# Patient Record
Sex: Female | Born: 2000
Health system: Southern US, Community
[De-identification: ages and names within clinical notes are randomized; demographics above are authoritative.]

## PROBLEM LIST (undated history)

## (undated) DIAGNOSIS — L709 Acne, unspecified: Secondary | ICD-10-CM

## (undated) DIAGNOSIS — F909 Attention-deficit hyperactivity disorder, unspecified type: Secondary | ICD-10-CM

## (undated) DIAGNOSIS — N920 Excessive and frequent menstruation with regular cycle: Secondary | ICD-10-CM

## (undated) DIAGNOSIS — Z8742 Personal history of other diseases of the female genital tract: Secondary | ICD-10-CM

## (undated) HISTORY — DX: Personal history of other diseases of the female genital tract: Z87.42

## (undated) HISTORY — DX: Attention-deficit hyperactivity disorder, unspecified type: F90.9

## (undated) HISTORY — PX: WISDOM TOOTH EXTRACTION: SHX21

## (undated) HISTORY — DX: Acne, unspecified: L70.9

## (undated) HISTORY — DX: Excessive and frequent menstruation with regular cycle: N92.0

---
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
## 2018-01-27 DIAGNOSIS — F902 Attention-deficit hyperactivity disorder, combined type: Secondary | ICD-10-CM | POA: Diagnosis not present

## 2018-01-27 DIAGNOSIS — Z79899 Other long term (current) drug therapy: Secondary | ICD-10-CM | POA: Diagnosis not present

## 2018-03-09 MED FILL — ADDERALL XR 10 MG CAP SA: 10 | 30 days supply | Qty: 30 | Fill #0

## 2018-03-09 MED FILL — ADDERALL XR 20 MG CAP SA: 20 | 30 days supply | Qty: 30 | Fill #0

## 2018-04-12 DIAGNOSIS — Z79899 Other long term (current) drug therapy: Secondary | ICD-10-CM | POA: Diagnosis not present

## 2018-04-12 DIAGNOSIS — F902 Attention-deficit hyperactivity disorder, combined type: Secondary | ICD-10-CM | POA: Diagnosis not present

## 2018-04-12 MED FILL — ADDERALL XR 20 MG CAP SA: 20 | 30 days supply | Qty: 30 | Fill #0

## 2018-04-12 MED FILL — ADDERALL XR 10 MG CAP SA: 10 | 30 days supply | Qty: 30 | Fill #0

## 2018-05-08 MED FILL — ADDERALL XR 20 MG CAP SA: 20 | 30 days supply | Qty: 30 | Fill #0

## 2018-06-15 MED FILL — ADDERALL XR 20 MG CAP SA: 20 | 30 days supply | Qty: 30 | Fill #0

## 2018-06-23 ENCOUNTER — Encounter: Payer: Self-pay | Admitting: Family Medicine

## 2018-06-23 ENCOUNTER — Other Ambulatory Visit: Payer: Self-pay

## 2018-06-23 ENCOUNTER — Ambulatory Visit: Payer: 59 | Admitting: Family Medicine

## 2018-06-23 VITALS — BP 122/70 | HR 78 | Temp 98.6°F | Ht 66.0 in | Wt 140.2 lb

## 2018-06-23 DIAGNOSIS — Z30011 Encounter for initial prescription of contraceptive pills: Secondary | ICD-10-CM | POA: Diagnosis not present

## 2018-06-23 DIAGNOSIS — N920 Excessive and frequent menstruation with regular cycle: Secondary | ICD-10-CM | POA: Diagnosis not present

## 2018-06-23 DIAGNOSIS — J302 Other seasonal allergic rhinitis: Secondary | ICD-10-CM | POA: Diagnosis not present

## 2018-06-23 DIAGNOSIS — F909 Attention-deficit hyperactivity disorder, unspecified type: Secondary | ICD-10-CM | POA: Diagnosis not present

## 2018-06-23 DIAGNOSIS — Z87898 Personal history of other specified conditions: Secondary | ICD-10-CM | POA: Diagnosis not present

## 2018-06-23 LAB — POCT URINE PREGNANCY: Preg Test, Ur: NEGATIVE

## 2018-06-23 MED ORDER — NORETHIN ACE-ETH ESTRAD-FE 1-20 MG-MCG(24) PO TABS: 1.0000 | ORAL_TABLET | Freq: Every day | ORAL | 11 refills | Status: DC

## 2018-06-23 MED FILL — BLISOVI 24 FE 1-20 MG-MCG(2: 1-20 | 84 days supply | Qty: 84 | Fill #0

## 2018-06-23 NOTE — Progress Notes (Signed)
Subjective:    Patient ID: Mary Steele, female    DOB: 08-Sep-2000, 18 y.o.   MRN: [id number]  HPI Chief Complaint  Patient presents with  . new pt    new pt, get estbalished.. would like adhd- sees [place] [facility]- but very pricey, would like to start on birth control for cramps.    She is new to the practice and is here to establish care.  She would like to discuss birth control with heavy painful periods.  Previous medical care: [facility]  Other providers: [facility]    Complains of severe cramping and heavy bleeding for the first few days of her cycle each month.  She also reports having nausea and vomiting at times as well as diarrhea.  Her periods last for 5 days.  They are regular.  She denies having any PMS symptoms.  States she has tried multiple over-the-counter medication such as Midol, Motrin, and others.  States she has tried taking them for 1 to 2 days before her period begins and it still does not help.  She would like to see if hormones help improve her symptoms. Denies personal or family history of blood clotting disorder. No migraine headache history.   Denies being sexually active for at least the past 6 months but states she has been sexually active prior to that.  States she does not plan on becoming sexually active again anytime soon.  She is a Medical laboratory scientific officer at [employer].   States she was diagnosed with ADHD in July 2019 at [facility].  States they have been adjusting her medication dose and she is happy with her current regimen. States she is not happy with how often she is having to see them and the testing she is having to do at each visit. She also states the cost to go there is an issue.  She is requesting that I take over prescribing her ADHD medication.  States she is currently taking Adderall XR 20 mg in the morning. It lasts until 3-4 pm. She then takes 10 mg around 4 PM if needed.   States at  one point the dose they had her on was too high.  States she had palpitations and lost weight so her dose had to be readjusted last November.  States she had to go to an urgent care in [place] to be evaluated for palpitations.  States her EKG was normal and they determined that the high-dose Concerta as she was taking at the time was causing her symptoms.  States she has not had palpitations since her medication was readjusted in November. She cannot recall the last time she had blood work done.  Seasonal allergies- Zyrtec daily   Denies smoking, drinking alcohol, drug use  Diet: Fairly healthy She does exercise  Reviewed allergies, medications, past medical, surgical, family, and social history.   Review of Systems Pertinent positives and negatives in the history of present illness.     Objective:   Physical Exam BP 122/70   Pulse 78   Temp 98.6 F (37 C) (Oral)   Ht 5\' 6"  (1.676 m)   Wt 140 lb 3.2 oz (63.6 kg)   LMP 06/02/2018   SpO2 98%   BMI 22.63 kg/m   Alert and in no distress.  Neck is supple without adenopathy or thyromegaly. Cardiac exam shows a regular sinus rhythm without murmurs or gallops. Lungs are clear to auscultation. Extremities without edema. Skin is warm and dry, no  pallor or rash.    UPT is negative     Assessment & Plan:  Menorrhagia with regular cycle - Plan: Comprehensive metabolic panel, TSH, T4, free, POCT urine pregnancy, CBC with Differential/Platelet, Norethindrone Acetate-Ethinyl Estrad-FE (BLISOVI 24 FE) 1-20 MG-MCG(24) tablet  Attention deficit hyperactivity disorder (ADHD), unspecified ADHD type  History of palpitations - Plan: TSH, T4, free  Encounter for BCP (birth control pills) initial prescription - Plan: POCT urine pregnancy, Norethindrone Acetate-Ethinyl Estrad-FE (BLISOVI 24 FE) 1-20 MG-MCG(24) tablet  Seasonal allergies  She is a pleasant 18 year old female who is new to me and here to establish care.  We will start her on  birth control pills to see if we can help with menorrhagia.  Her cycles are regular. States she will not have an issue taking the pill at the same time every day.  We discussed potential side effects and that the effectiveness of the pill decreases if not taken appropriately or if she has to take antibiotics.  Advised her to use a second level of protection if she does have sex in the next week.  We also discussed that birth control pills are not protection against STDs. [place] being [place] [facility] for [facility].  I will request records from there since she wants me to take over prescribing her Adderall.  She is aware that I will need to look over the notes and recommendations from them before I make the decision on whether I am comfortable prescribing this for her. Seasonal allergies are well controlled. Follow-up pending labs or if birth control is not helping with her painful periods.

## 2018-06-23 NOTE — Patient Instructions (Signed)

## 2018-06-24 LAB — COMPREHENSIVE METABOLIC PANEL
ALT: 6 IU/L (ref 0–32)
AST: 14 IU/L (ref 0–40)
Albumin/Globulin Ratio: 2 (ref 1.2–2.2)
Albumin: 4.8 g/dL (ref 3.9–5.0)
Alkaline Phosphatase: 71 IU/L (ref 39–117)
BUN/Creatinine Ratio: 10 (ref 9–23)
BUN: 8 mg/dL (ref 6–20)
Bilirubin Total: 0.7 mg/dL (ref 0.0–1.2)
CO2: 21 mmol/L (ref 20–29)
Calcium: 9.5 mg/dL (ref 8.7–10.2)
Chloride: 101 mmol/L (ref 96–106)
Creatinine, Ser: 0.8 mg/dL (ref 0.57–1.00)
GFR calc Af Amer: 124 mL/min/{1.73_m2} (ref >59)
GFR calc non Af Amer: 107 mL/min/{1.73_m2} (ref >59)
Globulin, Total: 2.4 g/dL (ref 1.5–4.5)
Glucose: 76 mg/dL (ref 65–99)
Potassium: 4.5 mmol/L (ref 3.5–5.2)
Sodium: 139 mmol/L (ref 134–144)
Total Protein: 7.2 g/dL (ref 6.0–8.5)

## 2018-06-24 LAB — CBC WITH DIFFERENTIAL/PLATELET
Basophils Absolute: 0.1 10*3/uL (ref 0.0–0.2)
Basos: 1 %
EOS (ABSOLUTE): 0.1 10*3/uL (ref 0.0–0.4)
Eos: 1 %
Hematocrit: 41.1 % (ref 34.0–46.6)
Hemoglobin: 14.2 g/dL (ref 11.1–15.9)
Immature Grans (Abs): 0 10*3/uL (ref 0.0–0.1)
Immature Granulocytes: 0 %
Lymphocytes Absolute: 2.7 10*3/uL (ref 0.7–3.1)
Lymphs: 33 %
MCH: 31.5 pg (ref 26.6–33.0)
MCHC: 34.5 g/dL (ref 31.5–35.7)
MCV: 91 fL (ref 79–97)
Monocytes Absolute: 0.4 10*3/uL (ref 0.1–0.9)
Monocytes: 5 %
Neutrophils Absolute: 4.9 10*3/uL (ref 1.4–7.0)
Neutrophils: 60 %
Platelets: 316 10*3/uL (ref 150–450)
RBC: 4.51 x10E6/uL (ref 3.77–5.28)
RDW: 12 % (ref 11.7–15.4)
WBC: 8.1 10*3/uL (ref 3.4–10.8)

## 2018-06-24 LAB — T4, FREE: Free T4: 1.51 ng/dL (ref 0.93–1.60)

## 2018-06-24 LAB — TSH: TSH: 1.21 u[IU]/mL (ref 0.450–4.500)

## 2018-06-28 DIAGNOSIS — L738 Other specified follicular disorders: Secondary | ICD-10-CM | POA: Diagnosis not present

## 2018-06-28 DIAGNOSIS — L0101 Non-bullous impetigo: Secondary | ICD-10-CM | POA: Diagnosis not present

## 2018-06-28 DIAGNOSIS — B070 Plantar wart: Secondary | ICD-10-CM | POA: Diagnosis not present

## 2018-06-28 MED FILL — DOXYCYCLINE HYCLATE 100 MG: 100 | 14 days supply | Qty: 28 | Fill #0

## 2018-06-28 MED FILL — MUPIROCIN 2% OINTMENT: 2 | 10 days supply | Qty: 22 | Fill #0

## 2018-07-03 ENCOUNTER — Telehealth: Payer: Self-pay | Admitting: Family Medicine

## 2018-07-03 NOTE — Telephone Encounter (Signed)
Pt called and I gave her lab results.

## 2018-07-06 MED FILL — MUPIROCIN 2% OINTMENT: 2 | 10 days supply | Qty: 22 | Fill #0

## 2018-07-13 DIAGNOSIS — Z79899 Other long term (current) drug therapy: Secondary | ICD-10-CM | POA: Diagnosis not present

## 2018-07-13 DIAGNOSIS — F902 Attention-deficit hyperactivity disorder, combined type: Secondary | ICD-10-CM | POA: Diagnosis not present

## 2018-07-13 MED FILL — ADDERALL XR 20 MG CAP SA: 20 | 30 days supply | Qty: 30 | Fill #0

## 2018-07-20 MED FILL — MUPIROCIN 2% OINTMENT: 2 | 10 days supply | Qty: 22 | Fill #1

## 2018-07-20 MED FILL — ADDERALL XR 10 MG CAP SA: 10 | 30 days supply | Qty: 30 | Fill #0

## 2018-07-27 MED FILL — MUPIROCIN 2% OINTMENT: 2 | 10 days supply | Qty: 22 | Fill #2

## 2018-08-02 DIAGNOSIS — B070 Plantar wart: Secondary | ICD-10-CM | POA: Diagnosis not present

## 2018-08-11 MED FILL — ADDERALL XR 20 MG CAP SA: 20 | 30 days supply | Qty: 30 | Fill #0

## 2018-08-11 MED FILL — MUPIROCIN 2% OINTMENT: 2 | 10 days supply | Qty: 22 | Fill #3

## 2018-09-08 ENCOUNTER — Other Ambulatory Visit: Payer: Self-pay | Admitting: *Deleted

## 2018-09-08 DIAGNOSIS — Z20822 Contact with and (suspected) exposure to covid-19: Secondary | ICD-10-CM

## 2018-09-10 LAB — NOVEL CORONAVIRUS, NAA: SARS-CoV-2, NAA: NOT DETECTED

## 2018-09-10 MED FILL — MUPIROCIN 2% OINTMENT: 2 | 10 days supply | Qty: 22 | Fill #4

## 2018-09-11 MED FILL — ADDERALL XR 10 MG CAP SA: 10 | 30 days supply | Qty: 30 | Fill #0

## 2018-09-11 MED FILL — ADDERALL XR 20 MG CAP SA: 20 | 30 days supply | Qty: 30 | Fill #0

## 2018-09-13 ENCOUNTER — Telehealth: Payer: Self-pay | Admitting: Family Medicine

## 2018-09-13 NOTE — Telephone Encounter (Signed)
Requested records received from [place] [facility]

## 2018-09-20 MED FILL — BLISOVI 24 FE 1-20 MG-MCG(2: 1-20 | 84 days supply | Qty: 84 | Fill #1

## 2018-10-13 DIAGNOSIS — Z79899 Other long term (current) drug therapy: Secondary | ICD-10-CM | POA: Diagnosis not present

## 2018-10-13 DIAGNOSIS — F902 Attention-deficit hyperactivity disorder, combined type: Secondary | ICD-10-CM | POA: Diagnosis not present

## 2018-10-13 MED FILL — ADDERALL XR 20 MG CAP SA: 20 | 30 days supply | Qty: 30 | Fill #0

## 2018-10-13 MED FILL — ADDERALL XR 25 MG CAPSULE: 25 | 30 days supply | Qty: 30 | Fill #0

## 2018-10-30 MED FILL — MUPIROCIN 2% OINTMENT: 2 | 10 days supply | Qty: 22 | Fill #5

## 2018-11-13 MED FILL — ADDERALL XR 25 MG CAPSULE: 25 | 30 days supply | Qty: 30 | Fill #0

## 2018-11-23 MED FILL — ADDERALL XR 20 MG CAP SA: 20 | 30 days supply | Qty: 30 | Fill #0

## 2018-12-14 MED FILL — BLISOVI 24 FE 1-20 MG-MCG(2: 1-20 | 84 days supply | Qty: 84 | Fill #2

## 2018-12-26 MED FILL — ADDERALL XR 20 MG CAP SA: 20 | 30 days supply | Qty: 30 | Fill #0

## 2018-12-26 MED FILL — ADDERALL XR 25 MG CAPSULE: 25 | 30 days supply | Qty: 30 | Fill #0

## 2019-01-22 MED FILL — MUPIROCIN 2% OINTMENT: 2 | 10 days supply | Qty: 22 | Fill #0

## 2019-02-07 MED FILL — ADDERALL XR 20 MG CAP SA: 20 | 30 days supply | Qty: 30 | Fill #0

## 2019-02-07 MED FILL — ADDERALL XR 25 MG CAPSULE: 25 | 30 days supply | Qty: 30 | Fill #0

## 2019-02-14 ENCOUNTER — Ambulatory Visit: Payer: 59 | Attending: Internal Medicine

## 2019-03-02 MED FILL — BLISOVI 24 FE 1-20 MG-MCG(2: 1-20 | 84 days supply | Qty: 84 | Fill #3

## 2019-03-12 ENCOUNTER — Ambulatory Visit: Payer: 59 | Attending: Internal Medicine

## 2019-03-12 DIAGNOSIS — Z20822 Contact with and (suspected) exposure to covid-19: Secondary | ICD-10-CM

## 2019-03-13 LAB — NOVEL CORONAVIRUS, NAA: SARS-CoV-2, NAA: NOT DETECTED

## 2019-03-13 LAB — SPECIMEN STATUS REPORT

## 2019-04-06 DIAGNOSIS — Z79899 Other long term (current) drug therapy: Secondary | ICD-10-CM | POA: Diagnosis not present

## 2019-04-06 DIAGNOSIS — F902 Attention-deficit hyperactivity disorder, combined type: Secondary | ICD-10-CM | POA: Diagnosis not present

## 2019-04-06 MED FILL — ADDERALL XR 25 MG CAPSULE: 25 | 30 days supply | Qty: 30 | Fill #0

## 2019-04-06 MED FILL — ADDERALL XR 20 MG CAP SA: 20 | 30 days supply | Qty: 30 | Fill #0

## 2019-05-10 MED FILL — ADDERALL XR 25 MG CAPSULE: 25 | 30 days supply | Qty: 30 | Fill #0

## 2019-05-10 MED FILL — ADDERALL XR 20 MG CAP SA: 20 | 30 days supply | Qty: 30 | Fill #0

## 2019-05-25 ENCOUNTER — Other Ambulatory Visit: Payer: Self-pay | Admitting: Family Medicine

## 2019-05-25 DIAGNOSIS — N920 Excessive and frequent menstruation with regular cycle: Secondary | ICD-10-CM

## 2019-05-25 DIAGNOSIS — Z30011 Encounter for initial prescription of contraceptive pills: Secondary | ICD-10-CM

## 2019-05-25 MED FILL — TARINA 24 FE 1-20 MG-MCG(24: 1-20 | 84 days supply | Qty: 84 | Fill #0

## 2019-05-25 NOTE — Telephone Encounter (Signed)
Left message for pt to call back and schedule an appt. Refill for 30 days

## 2019-06-18 MED FILL — ADDERALL XR 25 MG CAPSULE: 25 | 30 days supply | Qty: 30 | Fill #0

## 2019-06-18 MED FILL — ADDERALL XR 20 MG CAP SA: 20 | 30 days supply | Qty: 30 | Fill #0

## 2019-06-29 ENCOUNTER — Ambulatory Visit (INDEPENDENT_AMBULATORY_CARE_PROVIDER_SITE_OTHER): Payer: 59 | Admitting: Family Medicine

## 2019-06-29 ENCOUNTER — Other Ambulatory Visit: Payer: Self-pay

## 2019-06-29 ENCOUNTER — Encounter: Payer: Self-pay | Admitting: Family Medicine

## 2019-06-29 VITALS — BP 110/72 | HR 68 | Ht 66.5 in | Wt 145.0 lb

## 2019-06-29 DIAGNOSIS — R002 Palpitations: Secondary | ICD-10-CM

## 2019-06-29 DIAGNOSIS — R000 Tachycardia, unspecified: Secondary | ICD-10-CM

## 2019-06-29 DIAGNOSIS — Z0000 Encounter for general adult medical examination without abnormal findings: Secondary | ICD-10-CM

## 2019-06-29 DIAGNOSIS — R42 Dizziness and giddiness: Secondary | ICD-10-CM | POA: Diagnosis not present

## 2019-06-29 DIAGNOSIS — Z8249 Family history of ischemic heart disease and other diseases of the circulatory system: Secondary | ICD-10-CM | POA: Diagnosis not present

## 2019-06-29 DIAGNOSIS — F909 Attention-deficit hyperactivity disorder, unspecified type: Secondary | ICD-10-CM | POA: Diagnosis not present

## 2019-06-29 DIAGNOSIS — Z111 Encounter for screening for respiratory tuberculosis: Secondary | ICD-10-CM

## 2019-06-29 DIAGNOSIS — Z862 Personal history of diseases of the blood and blood-forming organs and certain disorders involving the immune mechanism: Secondary | ICD-10-CM

## 2019-06-29 DIAGNOSIS — Z8349 Family history of other endocrine, nutritional and metabolic diseases: Secondary | ICD-10-CM | POA: Diagnosis not present

## 2019-06-29 NOTE — Patient Instructions (Signed)
Preventive Care 18-21 Years Old, Female Preventive care refers to lifestyle choices and visits with your health care provider that can promote health and wellness. At this stage in your life, you may start seeing a primary care physician instead of a pediatrician. Your health care is now your responsibility. Preventive care for young adults includes:  A yearly physical exam. This is also called an annual wellness visit.  Regular dental and eye exams.  Immunizations.  Screening for certain conditions.  Healthy lifestyle choices, such as diet and exercise. What can I expect for my preventive care visit? Physical exam Your health care provider may check:  Height and weight. These may be used to calculate body mass index (BMI), which is a measurement that tells if you are at a healthy weight.  Heart rate and blood pressure.  Body temperature. Counseling Your health care provider may ask you questions about:  Past medical problems and family medical history.  Alcohol, tobacco, and drug use.  Home and relationship well-being.  Access to firearms.  Emotional well-being.  Diet, exercise, and sleep habits.  Sexual activity and sexual health.  Method of birth control.  Menstrual cycle.  Pregnancy history. What immunizations do I need?  Influenza (flu) vaccine  This is recommended every year. Tetanus, diphtheria, and pertussis (Tdap) vaccine  You may need a Td booster every 10 years. Varicella (chickenpox) vaccine  You may need this vaccine if you have not already been vaccinated. Human papillomavirus (HPV) vaccine  If recommended by your health care provider, you may need three doses over 6 months. Measles, mumps, and rubella (MMR) vaccine  You may need at least one dose of MMR. You may also need a second dose. Meningococcal conjugate (MenACWY) vaccine  One dose is recommended if you are 19-21 years old and a first-year college student living in a residence hall,  or if you have one of several medical conditions. You may also need additional booster doses. Pneumococcal conjugate (PCV13) vaccine  You may need this if you have certain conditions and were not previously vaccinated. Pneumococcal polysaccharide (PPSV23) vaccine  You may need one or two doses if you smoke cigarettes or if you have certain conditions. Hepatitis A vaccine  You may need this if you have certain conditions or if you travel or work in places where you may be exposed to hepatitis A. Hepatitis B vaccine  You may need this if you have certain conditions or if you travel or work in places where you may be exposed to hepatitis B. Haemophilus influenzae type b (Hib) vaccine  You may need this if you have certain risk factors. You may receive vaccines as individual doses or as more than one vaccine together in one shot (combination vaccines). Talk with your health care provider about the risks and benefits of combination vaccines. What tests do I need? Blood tests  Lipid and cholesterol levels. These may be checked every 5 years starting at age 20.  Hepatitis C test.  Hepatitis B test. Screening  Pelvic exam and Pap test. This may be done every 3 years starting at age 21.  Sexually transmitted disease (STD) testing, if you are at risk.  BRCA-related cancer screening. This may be done if you have a family history of breast, ovarian, tubal, or peritoneal cancers. Other tests  Tuberculosis skin test.  Vision and hearing tests.  Skin exam.  Breast exam. Follow these instructions at home: Eating and drinking   Eat a diet that includes fresh fruits and   vegetables, whole grains, lean protein, and low-fat dairy products.  Drink enough fluid to keep your urine pale yellow.  Do not drink alcohol if: ? Your health care provider tells you not to drink. ? You are pregnant, may be pregnant, or are planning to become pregnant. ? You are under the legal drinking age. In the  [place] the legal drinking age is 36.  If you drink alcohol: ? Limit how much you have to 0-1 drink a day. ? Be aware of how much alcohol is in your drink. In the [place] one drink equals one 12 oz bottle of beer (355 mL), one 5 oz glass of wine (148 mL), or one 1 oz glass of hard liquor (44 mL). Lifestyle  Take daily care of your teeth and gums.  Stay active. Exercise at least 30 minutes 5 or more days of the week.  Do not use any products that contain nicotine or tobacco, such as cigarettes, e-cigarettes, and chewing tobacco. If you need help quitting, ask your health care provider.  Do not use drugs.  If you are sexually active, practice safe sex. Use a condom or other form of birth control (contraception) in order to prevent pregnancy and STIs (sexually transmitted infections). If you plan to become pregnant, see your health care provider for a pre-conception visit.  Find healthy ways to cope with stress, such as: ? Meditation, yoga, or listening to music. ? Journaling. ? Talking to a trusted person. ? Spending time with friends and family. Safety  Always wear your seat belt while driving or riding in a vehicle.  Do not drive if you have been drinking alcohol. Do not ride with someone who has been drinking.  Do not drive when you are tired or distracted. Do not text while driving.  Wear a helmet and other protective equipment during sports activities.  If you have firearms in your house, make sure you follow all gun safety procedures.  Seek help if you have been bullied, physically abused, or sexually abused.  Use the Internet responsibly to avoid dangers such as online bullying and online sex predators. What's next?  Go to your health care provider once a year for a well check visit.  Ask your health care provider how often you should have your eyes and teeth checked.  Stay up to date on all vaccines. This information is not intended to replace advice given to you by  your health care provider. Make sure you discuss any questions you have with your health care provider. Document Revised: 01/05/2018 Document Reviewed: 01/05/2018 Elsevier Patient Education  2020 [employer].

## 2019-06-29 NOTE — Progress Notes (Signed)
Subjective:    Patient ID: Mary Steele, female    DOB: [birth year], 19 y.o.   MRN: [id number]  HPI Chief Complaint  Patient presents with  . CPE    CPe and needs Quantiferon gold for school   She is new to the practice and here for a complete physical exam.  Other providers: [facility] attention specialist -ADHD   ADHD- takeing Adderall 25 mg XR at 8 am and it lasts until 4 pm usually.  States then she occasionally takes an additional 20 mg XR.  States she is having difficulty with sleeping.  She questions whether I would be willing to take over her medication. States she has a follow-up scheduled with Rachel Moulds in June.   Complains of a fast heart rate with resting and with exercise.  States she has palpitations and is symptomatic with occasional blurred vision and lightheadedness when her pulse is fast.  Denies syncope.  Denies chest pain or shortness of breath. States she recently stopped running and exercising due to her symptoms. She has a watch to monitor her heart rate and states occasionally her pulse goes over 100 when she is sitting still.  With exercise she had noted a pulse in the 190s and even over 200 at times.  States she and her parents are concerned about this.  States her father has a history of an MI in his 31s.   Social history: Lives with her parents, at [employer] in summer school, just  Denies smoking, drinking alcohol, drug use  Diet: fairly healthy  Excerise: has been limiting her exercise recently due to palpitations   Immunizations: UTD.   Health maintenance:  Last Menstrual cycle: currently on her cycle Denies ever being sexually active. Last Dental Exam: every 6 months  Last Eye Exam: years ago   Wears seatbelt always, uses sunscreen, smoke detectors in home and functioning, does not text while driving and feels safe in home environment.   Reviewed allergies, medications, past medical, surgical, family, and social  history.     Review of Systems Review of Systems Constitutional: -fever, -chills, -sweats, -unexpected weight change,-fatigue ENT: -runny nose, -ear pain, -sore throat Cardiology:  -chest pain, -palpitations, -edema Respiratory: -cough, -shortness of breath, -wheezing Gastroenterology: -abdominal pain, -nausea, -vomiting, -diarrhea, -constipation  Hematology: -bleeding or bruising problems Musculoskeletal: -arthralgias, -myalgias, -joint swelling, -back pain Ophthalmology: -vision changes Urology: -dysuria, -difficulty urinating, -hematuria, -urinary frequency, -urgency Neurology: -headache, -weakness, -tingling, -numbness       Objective:   Physical Exam BP 110/72   Pulse 68   Ht 5' 6.5" (1.689 m)   Wt 145 lb (65.8 kg)   BMI 23.05 kg/m   General Appearance:    Alert, cooperative, no distress, appears stated age  Head:    Normocephalic, without obvious abnormality, atraumatic  Eyes:    PERRL, conjunctiva/corneas clear, EOM's intact  Ears:    Normal TM's and external ear canals  Nose:   Mask in place   Throat:   Mask in place   Neck:   Supple, no lymphadenopathy;  thyroid:  no   enlargement/tenderness/nodules; no JVD  Back:    Spine nontender, no curvature, ROM normal, no CVA     tenderness  Lungs:     Clear to auscultation bilaterally without wheezes, rales or     ronchi; respirations unlabored  Chest Wall:    No tenderness or deformity   Heart:    Regular rate and rhythm, S1 and S2 normal, no murmur,  rub   or gallop  Breast Exam:    Declines   Abdomen:     Soft, non-tender, nondistended, normoactive bowel sounds,    no masses, no hepatosplenomegaly  Genitalia:    Declines   Rectal:    Not performed due to age<40 and no related complaints  Extremities:   No clubbing, cyanosis or edema  Pulses:   2+ and symmetric all extremities  Skin:   Skin color, texture, turgor normal, no rashes or lesions  Lymph nodes:   Cervical, supraclavicular, and axillary nodes normal   Neurologic:   CNII-XII intact, normal strength, sensation and gait; reflexes 2+ and symmetric throughout          Psych:   Normal mood, affect, hygiene and grooming.         Assessment & Plan:  Routine general medical examination at a health care facility - Plan: CBC with Differential/Platelet, Comprehensive metabolic panel -She is here today for CPE. Preventive health care reviewed. She seems to be taking good care of herself. We did discuss healthy diet and exercise. Recommend regular dental exams. Immunizations reviewed. Discussed safety and health promotion.  Attention deficit hyperactivity disorder (ADHD), unspecified ADHD type -she will continue seeing Amy Elisabeth Most for now but discussed once she is stable on medication that we can consider taking care of this for her.   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   Rapid resting heart rate - Plan: EKG 12-Lead, Ambulatory referral to Cardiology Did not capture this today. Discussed ordering a holter monitor vs referral to cardiology and she did opt for a referral.   Intermittent palpitations - Plan: TSH, T4, free, T3, EKG 12-Lead, Iron, TIBC and Ferritin Panel, Ambulatory referral to Cardiology ECG shows NSR with arrhythmia, rate 72, PR interval 136 ms, QRS 82 ms, QT/QTc 366/400 ms. No ST changes. Refer to cardiology for further evaluation   Light headedness - Plan: EKG 12-Lead, Iron, TIBC and Ferritin Panel Seems to be associated with intense exercise only.  Hx of anemia. Is not on iron now.   History of anemia - Plan: CBC with Differential/Platelet, Iron, TIBC and Ferritin Panel -not currently on iron.   Family history of heart disease in female family member before age 16 - Plan: Ambulatory referral to Cardiology  Family history of thyroid disease in mother

## 2019-06-30 LAB — CBC WITH DIFFERENTIAL/PLATELET
Basophils Absolute: 0 10*3/uL (ref 0.0–0.2)
Basos: 1 %
EOS (ABSOLUTE): 0.1 10*3/uL (ref 0.0–0.4)
Eos: 1 %
Hematocrit: 43.8 % (ref 34.0–46.6)
Hemoglobin: 15.5 g/dL (ref 11.1–15.9)
Immature Grans (Abs): 0 10*3/uL (ref 0.0–0.1)
Immature Granulocytes: 0 %
Lymphocytes Absolute: 2.4 10*3/uL (ref 0.7–3.1)
Lymphs: 44 %
MCH: 31.8 pg (ref 26.6–33.0)
MCHC: 35.4 g/dL (ref 31.5–35.7)
MCV: 90 fL (ref 79–97)
Monocytes Absolute: 0.3 10*3/uL (ref 0.1–0.9)
Monocytes: 6 %
Neutrophils Absolute: 2.7 10*3/uL (ref 1.4–7.0)
Neutrophils: 48 %
Platelets: 276 10*3/uL (ref 150–450)
RBC: 4.88 x10E6/uL (ref 3.77–5.28)
RDW: 12.1 % (ref 11.7–15.4)
WBC: 5.6 10*3/uL (ref 3.4–10.8)

## 2019-06-30 LAB — COMPREHENSIVE METABOLIC PANEL
ALT: 15 IU/L (ref 0–32)
AST: 16 IU/L (ref 0–40)
Albumin/Globulin Ratio: 1.8 (ref 1.2–2.2)
Albumin: 4.8 g/dL (ref 3.9–5.0)
Alkaline Phosphatase: 59 IU/L (ref 45–106)
BUN/Creatinine Ratio: 13 (ref 9–23)
BUN: 9 mg/dL (ref 6–20)
Bilirubin Total: 0.5 mg/dL (ref 0.0–1.2)
CO2: 22 mmol/L (ref 20–29)
Calcium: 9.6 mg/dL (ref 8.7–10.2)
Chloride: 102 mmol/L (ref 96–106)
Creatinine, Ser: 0.71 mg/dL (ref 0.57–1.00)
GFR calc Af Amer: 142 mL/min/{1.73_m2} (ref >59)
GFR calc non Af Amer: 123 mL/min/{1.73_m2} (ref >59)
Globulin, Total: 2.6 g/dL (ref 1.5–4.5)
Glucose: 87 mg/dL (ref 65–99)
Potassium: 4.2 mmol/L (ref 3.5–5.2)
Sodium: 138 mmol/L (ref 134–144)
Total Protein: 7.4 g/dL (ref 6.0–8.5)

## 2019-06-30 LAB — IRON,TIBC AND FERRITIN PANEL
Ferritin: 31 ng/mL (ref 15–150)
Iron Saturation: 30 % (ref 15–55)
Iron: 143 ug/dL (ref 27–159)
Total Iron Binding Capacity: 477 ug/dL — ABNORMAL HIGH (ref 250–450)
UIBC: 334 ug/dL (ref 131–425)

## 2019-06-30 LAB — T4, FREE: Free T4: 1.55 ng/dL (ref 0.82–1.77)

## 2019-06-30 LAB — T3: T3, Total: 192 ng/dL — ABNORMAL HIGH (ref 71–180)

## 2019-06-30 LAB — TSH: TSH: 1.08 u[IU]/mL (ref 0.450–4.500)

## 2019-07-01 LAB — QUANTIFERON-TB GOLD PLUS
QuantiFERON Mitogen Value: >10 IU/mL
QuantiFERON Nil Value: 0 IU/mL
QuantiFERON TB1 Ag Value: 0 IU/mL
QuantiFERON TB2 Ag Value: 0.02 IU/mL
QuantiFERON-TB Gold Plus: NEGATIVE

## 2019-07-02 NOTE — Progress Notes (Signed)
Negative TB blood test. Ok to sign off on her forms.

## 2019-07-09 ENCOUNTER — Encounter: Payer: Self-pay | Admitting: Family Medicine

## 2019-07-13 ENCOUNTER — Telehealth: Payer: Self-pay | Admitting: Family Medicine

## 2019-07-13 ENCOUNTER — Other Ambulatory Visit: Payer: Self-pay

## 2019-07-13 DIAGNOSIS — Z8349 Family history of other endocrine, nutritional and metabolic diseases: Secondary | ICD-10-CM

## 2019-07-13 NOTE — Telephone Encounter (Signed)
Referral was put in and mother was advised. KH

## 2019-07-13 NOTE — Telephone Encounter (Signed)
I am fine with a referral to endocrinology. Ok to refer her due to family history of mother with thyroid cancer and patient has palpitations. I referred her to cardiology for her fast pulse and palpitations. Has she scheduled a visit yet? I strongly encourage her to see cardiology as well.

## 2019-07-13 NOTE — Telephone Encounter (Signed)
Pt mom Selena Batten called and is concerned about pts heart rate increasing. She said she was diagnosed with Graves disease and then Tyroid cancer and is wondering if pt needs to see an endocrinologist or return here for labs to check her thyroid. Kim  Can be reached at [phone]

## 2019-08-03 DIAGNOSIS — L738 Other specified follicular disorders: Secondary | ICD-10-CM | POA: Diagnosis not present

## 2019-08-03 DIAGNOSIS — Z79899 Other long term (current) drug therapy: Secondary | ICD-10-CM | POA: Diagnosis not present

## 2019-08-03 DIAGNOSIS — F902 Attention-deficit hyperactivity disorder, combined type: Secondary | ICD-10-CM | POA: Diagnosis not present

## 2019-08-03 DIAGNOSIS — L0889 Other specified local infections of the skin and subcutaneous tissue: Secondary | ICD-10-CM | POA: Diagnosis not present

## 2019-08-03 MED FILL — CLINDAMYCIN PHOSP 1% LOTION: 1 | 30 days supply | Qty: 60 | Fill #0

## 2019-08-03 MED FILL — VYVANSE 40 MG CHEW: 40 | 30 days supply | Qty: 30 | Fill #0

## 2019-08-11 ENCOUNTER — Other Ambulatory Visit: Payer: Self-pay | Admitting: Family Medicine

## 2019-08-11 DIAGNOSIS — Z30011 Encounter for initial prescription of contraceptive pills: Secondary | ICD-10-CM

## 2019-08-11 DIAGNOSIS — N920 Excessive and frequent menstruation with regular cycle: Secondary | ICD-10-CM

## 2019-08-13 ENCOUNTER — Other Ambulatory Visit: Payer: Self-pay | Admitting: Family Medicine

## 2019-08-13 MED FILL — TARINA 24 FE 1-20 MG-MCG(24: 1-20 | 84 days supply | Qty: 84 | Fill #0

## 2019-08-28 ENCOUNTER — Ambulatory Visit: Payer: 59 | Admitting: Endocrinology

## 2019-08-28 ENCOUNTER — Telehealth: Payer: Self-pay | Admitting: Family Medicine

## 2019-08-28 NOTE — Telephone Encounter (Signed)
Mom called & states pt goes to college tomorrow and we had referred her to Endocrinologist and they cancelled her appt and pt still having issues with heart racing.  She did see her ADD Dr. Marland Kitchen he changed her from Adderall to Vyvanse to see if that would help & still having issues.  Pt is going to be back in town Monday, Tuesday & Wednesday & would like to see somebody.

## 2019-09-02 ENCOUNTER — Encounter: Payer: Self-pay | Admitting: Family Medicine

## 2019-09-02 NOTE — Progress Notes (Signed)
Chief Complaint  Patient presents with   Palpitations    having some issues with racing heart. Has appt with Dr. Everardo All on 09/14/19. Won't be able to make it due to being away from school. Would like to have thryroid labs redrawn. Also states that she is on high dose ADD meds.    Patient presents to visit, accompanied by her mother. She recently established care with [facility]. She continues to have issues with heart racing. She has ADHD, and since her last visit here she saw her ADD provider (Dr. Marisue Brooklyn), who changed her from Adderall to Vyvanse to see if that would help.  She had also stopped ADD meds x 3 weeks, but didn't notice any difference. Stopped caffeine several months ago  She still is having issues, so presents today to discuss. They report that her pulse was 126 in lobby today.  Took vyvnase earlier today.  She moved back to school ([employer]) this past weekend, but came back for this appointment.  She was noted by [facility] to have elevated total T3, and was referred to endocrinologist.  Her appt with Dr. Everardo All was r/s from 8/3 to 8/20, and she will be back at college, unable to keep that visit, they want to know if it is truly needed, hoping for repeat thyroid tests today. She had been referred to Cardiology, but they weren't able to contact her to schedule appt (see letter in epic, dated 6/14).  They are friendly with Dr. Johney Frame, so they briefly discussed her symptoms with him, and he suggested going down the ADD/endocrinology path before pursuing cardiology work-up.  There is family history of thyroid disease--mother has h/o Grave's disease.  Patient denies changes to hair/skin/bowels/energy/weight. Main concern has been with tachycardia. Elevated HR has been noted at rest, and with exercise. A few times, while working out and HR very fast, she felt a little lightheaded, some blurred vision. She has never had chest pain, shortness of breath or syncope.  She uses a watch with heart  rate monitor. 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  She drank some water and it came down some.  The other day, while moving, she brought 1 box to the car, and her pulse went up to 149 and she felt out of breath. She is aware that sometimes the heartrate starts quickly, but hasn't really paid attention to the decline--whether it is gradual or sudden.  Father has a history of an MI in his 28s. Getting evaluated for issues with tachycardia by Dr. Johney Frame currently.   Review of labs: Lab Results  Component Value Date   TSH 1.080 06/29/2019   T3TOTAL 192 (H) 06/29/2019  (180 is upper limit normal for total T3)  Free T4 normal at 1.55  Lab Results  Component Value Date   WBC 5.6 06/29/2019   HGB 15.5 06/29/2019   HCT 43.8 06/29/2019   MCV 90 06/29/2019   PLT 276 06/29/2019   Lab Results  Component Value Date   IRON 143 06/29/2019   TIBC 477 (H) 06/29/2019   FERRITIN 31 06/29/2019     Chemistry      Component Value Date/Time   NA 138 06/29/2019 1044   K 4.2 06/29/2019 1044   CL 102 06/29/2019 1044   CO2 22 06/29/2019 1044   BUN 9 06/29/2019 1044   CREATININE 0.71 06/29/2019 1044      Component Value Date/Time   CALCIUM 9.6 06/29/2019 1044  ALKPHOS 59 06/29/2019 1044   AST 16 06/29/2019 1044   ALT 15 06/29/2019 1044   BILITOT 0.5 06/29/2019 1044      PMH, PSH, SH, FH reviewed  Outpatient Encounter Medications as of 09/03/2019  Medication Sig Note   cetirizine (ZYRTEC) 10 MG tablet Take 10 mg by mouth daily.    TARINA 24 FE 1-20 MG-MCG(24) tablet TAKE 1 TABLET BY MOUTH DAILY.    VYVANSE 40 MG CHEW Chew 1 tablet by mouth daily. 09/03/2019: 20mg  BID   clindamycin (CLEOCIN T) 1 % lotion Apply 1 application topically 2 (two) times daily. (Patient not taking: Reported on 09/03/2019)    [DISCONTINUED] ADDERALL XR 20 MG 24 hr capsule Take 1 tablet by mouth daily.    [DISCONTINUED] amphetamine-dextroamphetamine (ADDERALL  XR) 25 MG 24 hr capsule Take 25 mg by mouth every morning.    No facility-administered encounter medications on file as of 09/03/2019.   No Known Allergies  ROS: no fever, chills, URI symptoms, chest pain, hair/skin/nail/bowel/energy/weight changes. (weight gain noted in chart today, since last visit). No bleeding, rash. Some anxiety, no depression. Tachycardia per HPI.     PHYSICAL EXAM:  BP 130/80    Pulse (!) 120    Ht 5' 6.5" (1.689 m)    Wt 154 lb 9.6 oz (70.1 kg)    LMP 08/26/2019    BMI 24.58 kg/m   Repeat pulse by MD 80-85  Wt Readings from Last 3 Encounters:  09/03/19 154 lb 9.6 oz (70.1 kg)  06/29/19 145 lb (65.8 kg)  06/23/18 140 lb 3.2 oz (63.6 kg) (70 %, Z= 0.53)*   * Growth percentiles are based on CDC (Girls, 2-20 Years) data.   Well-appearing, pleasant female, in good spirits, in no distress. She is alert, oriented. HEENT: conjunctiva and sclera are clear, no proptosis.  EOMI.  Wearing mask Neck: no lymphadenopathy, thyromegaly or mass Heart: regular rate and rhythm, rate 80-85, no ectopy, no murmur Lungs: clear bilaterally Back: no spinal or CVA tenderness Abdomen: soft, nontender, no mass Extremities: no edema, normal pulses Psych: slightly anxious/worried. Full range of affect, normal eye contact, speech, hygiene and grooming. Mother seems more anxious than patient.  GAD-7 score of 10 (trouble relaxing and restless, pt with ADHD; worries a lot)   ASSESSMENT/PLAN:  Tachycardia - unclear etiology--suspect ADHD/stimulant meds +/- anxiety. Recheck thyroid tests per family request - Plan: T3, Free, T4, Free, TSH  Family history of thyroid disease in mother - Plan: T3, Free, T4, Free, TSH   Please pay attention to how the fast heart rate resolves--whether it is gradual, or sudden.  If gradual, it is less likely an arrhythmia (sinus tachycardia). Be sure to stay well hydrated. When you feel it very fast, try some relaxation techniques.  Limit caffeine and  avoid decongestants. Discuss medications and possible anxiety/medications with Dr. 06/25/18, to see if she thinks that could be contributing and if change in therapy could help your symptoms.  We briefly discussed SSRIs such as Lexapro, and also Buspar.

## 2019-09-03 ENCOUNTER — Encounter: Payer: Self-pay | Admitting: Family Medicine

## 2019-09-03 ENCOUNTER — Other Ambulatory Visit: Payer: Self-pay

## 2019-09-03 ENCOUNTER — Ambulatory Visit: Payer: 59 | Admitting: Family Medicine

## 2019-09-03 VITALS — BP 130/80 | HR 120 | Ht 66.5 in | Wt 154.6 lb

## 2019-09-03 DIAGNOSIS — Z8349 Family history of other endocrine, nutritional and metabolic diseases: Secondary | ICD-10-CM | POA: Diagnosis not present

## 2019-09-03 DIAGNOSIS — R000 Tachycardia, unspecified: Secondary | ICD-10-CM | POA: Diagnosis not present

## 2019-09-03 DIAGNOSIS — I471 Supraventricular tachycardia: Secondary | ICD-10-CM | POA: Diagnosis not present

## 2019-09-03 NOTE — Patient Instructions (Signed)
Please pay attention to how the fast heart rate resolves--whether it is gradual, or sudden.  If gradual, it is less likely an arrhythmia (sinus tachycardia). Be sure to stay well hydrated. When you feel it very fast, try some relaxation techniques (see more info below).  Limit caffeine and avoid decongestants. Discuss medications and possible anxiety/medications with Dr. Elisabeth Most, to see if she thinks that could be contributing and if change in therapy could help your symptoms.  We briefly discussed SSRIs such as Lexapro, and also Buspar.    Mindfulness-Based Stress Reduction Mindfulness-based stress reduction (MBSR) is a program that helps people learn to practice mindfulness. Mindfulness is the practice of intentionally paying attention to the present moment. It can be learned and practiced through techniques such as education, breathing exercises, meditation, and yoga. MBSR includes several mindfulness techniques in one program. MBSR works best when you understand the treatment, are willing to try new things, and can commit to spending time practicing what you learn. MBSR training may include learning about:  How your emotions, thoughts, and reactions affect your body.  New ways to respond to things that cause negative thoughts to start (triggers).  How to notice your thoughts and let go of them.  Practicing awareness of everyday things that you normally do without thinking.  The techniques and goals of different types of meditation. What are the benefits of MBSR? MBSR can have many benefits, which include helping you to:  Develop self-awareness. This refers to knowing and understanding yourself.  Learn skills and attitudes that help you to participate in your own health care.  Learn new ways to care for yourself.  Be more accepting about how things are, and let things go.  Be less judgmental and approach things with an open mind.  Be patient with yourself and trust  yourself more. MBSR has also been shown to:  Reduce negative emotions, such as depression and anxiety.  Improve memory and focus.  Change how you sense and approach pain.  Boost your body's ability to fight infections.  Help you connect better with other people.  Improve your sense of well-being. Follow these instructions at home:   Find a local in-person or online MBSR program.  Set aside some time regularly for mindfulness practice.  Find a mindfulness practice that works best for you. This may include one or more of the following: ? Meditation. Meditation involves focusing your mind on a certain thought or activity. ? Breathing awareness exercises. These help you to stay present by focusing on your breath. ? Body scan. For this practice, you lie down and pay attention to each part of your body from head to toe. You can identify tension and soreness and intentionally relax parts of your body. ? Yoga. Yoga involves stretching and breathing, and it can improve your ability to move and be flexible. It can also provide an experience of testing your body's limits, which can help you release stress. ? Mindful eating. This way of eating involves focusing on the taste, texture, color, and smell of each bite of food. Because this slows down eating and helps you feel full sooner, it can be an important part of a weight-loss plan.  Find a podcast or recording that provides guidance for breathing awareness, body scan, or meditation exercises. You can listen to these any time when you have a free moment to rest without distractions.  Follow your treatment plan as told by your health care provider. This may include taking regular medicines and  making changes to your diet or lifestyle as recommended. How to practice mindfulness To do a basic awareness exercise:  Find a comfortable place to sit.  Pay attention to the present moment. Observe your thoughts, feelings, and surroundings just as they  are.  Avoid placing judgment on yourself, your feelings, or your surroundings. Make note of any judgment that comes up, and let it go.  Your mind may wander, and that is okay. Make note of when your thoughts drift, and return your attention to the present moment. To do basic mindfulness meditation:  Find a comfortable place to sit. This may include a stable chair or a firm floor cushion. ? Sit upright with your back straight. Let your arms fall next to your side with your hands resting on your legs. ? If sitting in a chair, rest your feet flat on the floor. ? If sitting on a cushion, cross your legs in front of you.  Keep your head in a neutral position with your chin dropped slightly. Relax your jaw and rest the tip of your tongue on the roof of your mouth. Drop your gaze to the floor. You can close your eyes if you like.  Breathe normally and pay attention to your breath. Feel the air moving in and out of your nose. Feel your belly expanding and relaxing with each breath.  Your mind may wander, and that is okay. Make note of when your thoughts drift, and return your attention to your breath.  Avoid placing judgment on yourself, your feelings, or your surroundings. Make note of any judgment or feelings that come up, let them go, and bring your attention back to your breath.  When you are ready, lift your gaze or open your eyes. Pay attention to how your body feels after the meditation. Where to find more information You can find more information about MBSR from:  Your health care provider.  Community-based meditation centers or programs.  Programs offered near you. Summary  Mindfulness-based stress reduction (MBSR) is a program that teaches you how to intentionally pay attention to the present moment. It is used with other treatments to help you cope better with daily stress, emotions, and pain.  MBSR focuses on developing self-awareness, which allows you to respond to life stress  without judgment or negative emotions.  MBSR programs may involve learning different mindfulness practices, such as breathing exercises, meditation, yoga, body scan, or mindful eating. Find a mindfulness practice that works best for you, and set aside time for it on a regular basis. This information is not intended to replace advice given to you by your health care provider. Make sure you discuss any questions you have with your health care provider. Document Revised: 12/24/2016 Document Reviewed: 05/20/2016 Elsevier Patient Education  2020 [employer].

## 2019-09-04 ENCOUNTER — Ambulatory Visit: Payer: 59 | Admitting: Family Medicine

## 2019-09-04 LAB — TSH: TSH: 1.18 u[IU]/mL (ref 0.450–4.500)

## 2019-09-04 LAB — T4, FREE: Free T4: 1.44 ng/dL (ref 0.82–1.77)

## 2019-09-04 LAB — T3, FREE: T3, Free: 3.2 pg/mL (ref 2.0–4.4)

## 2019-09-04 NOTE — Telephone Encounter (Signed)
appt made and completed

## 2019-09-14 ENCOUNTER — Ambulatory Visit: Payer: 59 | Admitting: Endocrinology

## 2019-10-08 MED FILL — VYVANSE 40 MG CHEW: 40 | 30 days supply | Qty: 30 | Fill #0

## 2019-10-30 MED FILL — TARINA 24 FE 1-20 MG-MCG(24: 1-20 | 84 days supply | Qty: 84 | Fill #1

## 2019-12-03 ENCOUNTER — Other Ambulatory Visit (HOSPITAL_COMMUNITY): Payer: Self-pay | Admitting: Internal Medicine

## 2019-12-03 MED FILL — VYVANSE 40 MG CHEW: 40 | 30 days supply | Qty: 30 | Fill #0

## 2020-01-21 MED FILL — TARINA 24 FE 1-20 MG-MCG(24: 1-20 | 84 days supply | Qty: 84 | Fill #2

## 2020-01-23 ENCOUNTER — Other Ambulatory Visit (HOSPITAL_COMMUNITY): Payer: Self-pay | Admitting: Internal Medicine

## 2020-01-23 DIAGNOSIS — Z79899 Other long term (current) drug therapy: Secondary | ICD-10-CM | POA: Diagnosis not present

## 2020-01-23 DIAGNOSIS — F902 Attention-deficit hyperactivity disorder, combined type: Secondary | ICD-10-CM | POA: Diagnosis not present

## 2020-01-23 MED FILL — VYVANSE 40 MG CHEW: 40 | 30 days supply | Qty: 30 | Fill #0

## 2020-01-24 ENCOUNTER — Other Ambulatory Visit: Payer: Self-pay

## 2020-01-24 ENCOUNTER — Other Ambulatory Visit: Payer: Self-pay | Admitting: Family Medicine

## 2020-01-24 ENCOUNTER — Encounter: Payer: Self-pay | Admitting: Family Medicine

## 2020-01-24 ENCOUNTER — Ambulatory Visit: Payer: 59 | Admitting: Family Medicine

## 2020-01-24 VITALS — BP 130/80 | HR 117 | Temp 98.0°F | Resp 18 | Ht 66.0 in | Wt 164.2 lb

## 2020-01-24 DIAGNOSIS — R300 Dysuria: Secondary | ICD-10-CM

## 2020-01-24 DIAGNOSIS — N9089 Other specified noninflammatory disorders of vulva and perineum: Secondary | ICD-10-CM | POA: Diagnosis not present

## 2020-01-24 DIAGNOSIS — R809 Proteinuria, unspecified: Secondary | ICD-10-CM

## 2020-01-24 DIAGNOSIS — R350 Frequency of micturition: Secondary | ICD-10-CM

## 2020-01-24 DIAGNOSIS — N898 Other specified noninflammatory disorders of vagina: Secondary | ICD-10-CM | POA: Diagnosis not present

## 2020-01-24 DIAGNOSIS — N760 Acute vaginitis: Secondary | ICD-10-CM

## 2020-01-24 DIAGNOSIS — M545 Low back pain, unspecified: Secondary | ICD-10-CM

## 2020-01-24 DIAGNOSIS — B9689 Other specified bacterial agents as the cause of diseases classified elsewhere: Secondary | ICD-10-CM | POA: Diagnosis not present

## 2020-01-24 LAB — POCT URINALYSIS DIP (PROADVANTAGE DEVICE)
Blood, UA: NEGATIVE
Glucose, UA: NEGATIVE mg/dL
Ketones, POC UA: NEGATIVE mg/dL
Leukocytes, UA: NEGATIVE
Nitrite, UA: NEGATIVE
Protein Ur, POC: 30 mg/dL — AB
Specific Gravity, Urine: 1.015
pH, UA: 7.5 (ref 5.0–8.0)

## 2020-01-24 LAB — COMPREHENSIVE METABOLIC PANEL
ALT: 21 IU/L (ref 0–32)
AST: 14 IU/L (ref 0–40)
Albumin/Globulin Ratio: 1.4 (ref 1.2–2.2)
Albumin: 4.2 g/dL (ref 3.9–5.0)
Alkaline Phosphatase: 55 IU/L (ref 44–121)
BUN/Creatinine Ratio: 9 (ref 9–23)
BUN: 7 mg/dL (ref 6–20)
Bilirubin Total: 0.5 mg/dL (ref 0.0–1.2)
CO2: 20 mmol/L (ref 20–29)
Calcium: 9.4 mg/dL (ref 8.7–10.2)
Chloride: 102 mmol/L (ref 96–106)
Creatinine, Ser: 0.79 mg/dL (ref 0.57–1.00)
GFR calc Af Amer: 124 mL/min/{1.73_m2} (ref >59)
GFR calc non Af Amer: 107 mL/min/{1.73_m2} (ref >59)
Globulin, Total: 2.9 g/dL (ref 1.5–4.5)
Glucose: 95 mg/dL (ref 65–99)
Potassium: 3.8 mmol/L (ref 3.5–5.2)
Sodium: 139 mmol/L (ref 134–144)
Total Protein: 7.1 g/dL (ref 6.0–8.5)

## 2020-01-24 LAB — POCT WET PREP (WET MOUNT)
Clue Cells Wet Prep Whiff POC: NEGATIVE
Trichomonas Wet Prep HPF POC: ABSENT
WBC, Wet Prep HPF POC: NEGATIVE

## 2020-01-24 LAB — CBC WITH DIFFERENTIAL/PLATELET
Basophils Absolute: 0 10*3/uL (ref 0.0–0.2)
Basos: 0 %
EOS (ABSOLUTE): 0.1 10*3/uL (ref 0.0–0.4)
Eos: 1 %
Hematocrit: 43.5 % (ref 34.0–46.6)
Hemoglobin: 14.6 g/dL (ref 11.1–15.9)
Immature Grans (Abs): 0 10*3/uL (ref 0.0–0.1)
Immature Granulocytes: 0 %
Lymphocytes Absolute: 1.6 10*3/uL (ref 0.7–3.1)
Lymphs: 16 %
MCH: 30.4 pg (ref 26.6–33.0)
MCHC: 33.6 g/dL (ref 31.5–35.7)
MCV: 91 fL (ref 79–97)
Monocytes Absolute: 0.6 10*3/uL (ref 0.1–0.9)
Monocytes: 6 %
Neutrophils Absolute: 7.7 10*3/uL — ABNORMAL HIGH (ref 1.4–7.0)
Neutrophils: 77 %
Platelets: 274 10*3/uL (ref 150–450)
RBC: 4.8 x10E6/uL (ref 3.77–5.28)
RDW: 11.7 % (ref 11.7–15.4)
WBC: 10 10*3/uL (ref 3.4–10.8)

## 2020-01-24 MED ORDER — FLUCONAZOLE 150 MG PO TABS: 150.0000 mg | ORAL_TABLET | Freq: Once | ORAL | 0 refills | Status: DC

## 2020-01-24 MED ORDER — METRONIDAZOLE 500 MG PO TABS: 500.0000 mg | ORAL_TABLET | Freq: Two times a day (BID) | ORAL | 0 refills | Status: DC

## 2020-01-24 MED FILL — METRONIDAZOLE 500 MG TABS: 500 | 7 days supply | Qty: 14 | Fill #0

## 2020-01-24 MED FILL — FLUCONAZOLE 150 MG TABS: 150 | 1 days supply | Qty: 1 | Fill #0

## 2020-01-24 NOTE — Patient Instructions (Addendum)
Avoid alcohol with the antibiotic.   Try Tylenol 1,000 mg twice daily, heat or ice pack and a topical pain medication if you need to. This appears to be a musculoskeletal issue currently.   I will be in touch with your lab results and urine culture.    Bacterial Vaginosis  Bacterial vaginosis is an infection of the vagina. It happens when too many normal germs (healthy bacteria) grow in the vagina. This infection puts you at risk for infections from sex (STIs). Treating this infection can lower your risk for some STIs. You should also treat this if you are pregnant. It can cause your baby to be born early. Follow these instructions at home: Medicines  Take over-the-counter and prescription medicines only as told by your doctor.  Take or use your antibiotic medicine as told by your doctor. Do not stop taking or using it even if you start to feel better. General instructions  If you your sexual partner is a woman, tell her that you have this infection. She needs to get treatment if she has symptoms. If you have a female partner, he does not need to be treated.  During treatment: ? Avoid sex. ? Do not douche. ? Avoid alcohol as told. ? Avoid breastfeeding as told.  Drink enough fluid to keep your pee (urine) clear or pale yellow.  Keep your vagina and butt (rectum) clean. ? Wash the area with warm water every day. ? Wipe from front to back after you use the toilet.  Keep all follow-up visits as told by your doctor. This is important. Preventing this condition  Do not douche.  Use only warm water to wash around your vagina.  Use protection when you have sex. This includes: ? Latex condoms. ? Dental dams.  Limit how many people you have sex with. It is best to only have sex with the same person (be monogamous).  Get tested for STIs. Have your partner get tested.  Wear underwear that is cotton or lined with cotton.  Avoid tight pants and pantyhose. This is most important in  summer.  Do not use any products that have nicotine or tobacco in them. These include cigarettes and e-cigarettes. If you need help quitting, ask your doctor.  Do not use illegal drugs.  Limit how much alcohol you drink. Contact a doctor if:  Your symptoms do not get better, even after you are treated.  You have more discharge or pain when you pee (urinate).  You have a fever.  You have pain in your belly (abdomen).  You have pain with sex.  Your bleed from your vagina between periods. Summary  This infection happens when too many germs (bacteria) grow in the vagina.  Treating this condition can lower your risk for some infections from sex (STIs).  You should also treat this if you are pregnant. It can cause early (premature) birth.  Do not stop taking or using your antibiotic medicine even if you start to feel better. This information is not intended to replace advice given to you by your health care provider. Make sure you discuss any questions you have with your health care provider. Document Revised: 12/24/2016 Document Reviewed: 09/27/2015 Elsevier Patient Education  2020 [employer].

## 2020-01-24 NOTE — Progress Notes (Signed)
Subjective:    Patient ID: Mary Steele, female    DOB: 06-Jun-2000, 19 y.o.   MRN: [id number]  HPI Chief Complaint  Patient presents with  . Urinary Frequency    Started over a week ago with urinary frequency, burning and vaginal itching.    Complains of a 1 week history of urinary frequency and low back pain. She has increased her water intake.  She is also having dysuria, intermittent burning right after voiding. She has also noticed vaginal itching and vaginal discharge that has been "greenish".  Using a different soap for one week. She usually uses ph balance soap "Hillary [place]" but was out of it for one week. She is using it again.   Low back pain is bilateral, non radiating, intermittent and dull. Occasionally sharp and throbbing. Pain is worse with certain movements.   No sexual activity in the past year.   LMP: 01/09/2020    Reviewed allergies, medications, past medical, surgical, family, and social history.   Review of Systems Pertinent positives and negatives in the history of present illness.     Objective:   Physical Exam Exam conducted with a chaperone present.  Constitutional:      General: She is not in acute distress.    Appearance: Normal appearance. She is not ill-appearing.  Eyes:     Conjunctiva/sclera: Conjunctivae normal.     Pupils: Pupils are equal, round, and reactive to light.  Cardiovascular:     Rate and Rhythm: Regular rhythm. Tachycardia present.     Pulses: Normal pulses.  Pulmonary:     Effort: Pulmonary effort is normal.     Breath sounds: Normal breath sounds.  Genitourinary:    Vagina: Vaginal discharge present.     Comments: Mild erythema and irritation to external vulva mainly on labia majora. Thick, white discharge noted in the vaginal vault.  Musculoskeletal:     Cervical back: Normal range of motion and neck supple.  Skin:    General: Skin is warm and dry.  Neurological:     General: No focal deficit present.     Mental  Status: She is alert and oriented to person, place, and time.    BP 130/80   Pulse (!) 117   Temp 98 F (36.7 C)   Resp 18   Ht 5\' 6"  (1.676 m)   Wt 164 lb 3.2 oz (74.5 kg)   LMP 01/09/2020   BMI 26.50 kg/m        Assessment & Plan:  Bacterial vaginosis - Plan: metroNIDAZOLE (FLAGYL) 500 MG tablet  Vulvar irritation  Urinary frequency - Plan: POCT Urinalysis DIP (Proadvantage Device), Urine Culture, CBC with Differential/Platelet, Comprehensive metabolic panel  Proteinuria, unspecified type - Plan: CBC with Differential/Platelet, Comprehensive metabolic panel  Vaginal itching - Plan: POCT Wet Prep 01/11/2020 [facility])  Dysuria - Plan: Urine Culture  Acute bilateral low back pain without sciatica  UA does not show infection but I will send it for culture.  It does show protein and we discussed that I think this is a false positive and we will recheck this in 1-2 weeks.  I will also check serum renal function.  Discussed that the wet mount shows BV and a few yeast.  I will treat her with metronidazole. Advised to avoid alcohol with this medication. I will also prescribe her diflucan for few yeast. She may use hydrocortisone on external vulva for 2-3 days and also use sitz baths.  No chance of STD or pregnancy  per patient due to abstinence for the past year or longer.  Suspect low back pain is MSK in nature and discussed symptomatic treatment for this. She will let me know if she has any new or worsening symptoms. Follow up pending labs and in 1-2 weeks for recheck of her urine and symptoms.

## 2020-01-25 LAB — URINE CULTURE

## 2020-02-01 ENCOUNTER — Ambulatory Visit: Payer: 59 | Admitting: Family Medicine

## 2020-02-04 ENCOUNTER — Other Ambulatory Visit: Payer: Self-pay

## 2020-02-04 ENCOUNTER — Encounter: Payer: Self-pay | Admitting: Family Medicine

## 2020-02-04 ENCOUNTER — Ambulatory Visit: Payer: 59 | Admitting: Family Medicine

## 2020-02-04 VITALS — BP 120/70 | HR 83 | Wt 163.0 lb

## 2020-02-04 DIAGNOSIS — Z09 Encounter for follow-up examination after completed treatment for conditions other than malignant neoplasm: Secondary | ICD-10-CM

## 2020-02-04 DIAGNOSIS — R809 Proteinuria, unspecified: Secondary | ICD-10-CM

## 2020-02-04 LAB — POCT URINALYSIS DIP (PROADVANTAGE DEVICE)
Bilirubin, UA: NEGATIVE
Blood, UA: NEGATIVE
Glucose, UA: NEGATIVE mg/dL
Ketones, POC UA: NEGATIVE mg/dL
Leukocytes, UA: NEGATIVE
Nitrite, UA: NEGATIVE
Protein Ur, POC: NEGATIVE mg/dL
Specific Gravity, Urine: 1.02
Urobilinogen, Ur: NEGATIVE
pH, UA: 6 (ref 5.0–8.0)

## 2020-02-04 NOTE — Progress Notes (Signed)
   Subjective:    Patient ID: Mary Steele, female    DOB: 01-Apr-2000, 20 y.o.   MRN: [id number]  HPI Chief Complaint  Patient presents with  . follow-up    Follow-up. Doing well, all symptoms have cleared up   She is here to follow-up on vaginal itching, back pain, dysuria and protein in her urine.  States all of her symptoms have resolved.  She feels back to baseline today.  No new concerns.  We are rechecking her urine today due to protein last week.  She is driving back to [facility] today to start classes.    Review of Systems Pertinent positives and negatives in the history of present illness.     Objective:   Physical Exam BP 120/70   Pulse 83   Wt 163 lb (73.9 kg)   LMP 01/09/2020   BMI 26.31 kg/m   Alert and oriented and in no acute distress.  Not otherwise examined.      Assessment & Plan:  Proteinuria, unspecified type - Plan: POCT Urinalysis DIP (Proadvantage Device)  Follow up  Urinalysis dipstick is negative for protein and otherwise. She is back to her usual state of health.  No new concerns.

## 2020-03-06 ENCOUNTER — Other Ambulatory Visit (HOSPITAL_COMMUNITY): Payer: Self-pay | Admitting: Internal Medicine

## 2020-03-06 MED FILL — VYVANSE 40 MG CHEW: 40 | 30 days supply | Qty: 30 | Fill #0

## 2020-04-03 ENCOUNTER — Other Ambulatory Visit (HOSPITAL_COMMUNITY): Payer: Self-pay | Admitting: Internal Medicine

## 2020-04-03 DIAGNOSIS — F902 Attention-deficit hyperactivity disorder, combined type: Secondary | ICD-10-CM | POA: Diagnosis not present

## 2020-04-03 DIAGNOSIS — Z79899 Other long term (current) drug therapy: Secondary | ICD-10-CM | POA: Diagnosis not present

## 2020-04-03 MED FILL — VYVANSE 40 MG CHEW: 40 | 30 days supply | Qty: 30 | Fill #0

## 2020-05-02 ENCOUNTER — Encounter: Payer: Self-pay | Admitting: Internal Medicine

## 2020-05-22 NOTE — Telephone Encounter (Signed)
Confirmed this appt with pt

## 2020-05-27 ENCOUNTER — Other Ambulatory Visit (HOSPITAL_COMMUNITY): Payer: Self-pay

## 2020-06-05 ENCOUNTER — Other Ambulatory Visit: Payer: Self-pay | Admitting: Internal Medicine

## 2020-06-07 ENCOUNTER — Other Ambulatory Visit (HOSPITAL_COMMUNITY): Payer: Self-pay

## 2020-06-09 ENCOUNTER — Other Ambulatory Visit (HOSPITAL_COMMUNITY): Payer: Self-pay

## 2020-06-10 ENCOUNTER — Other Ambulatory Visit (HOSPITAL_COMMUNITY): Payer: Self-pay

## 2020-06-10 MED ORDER — VYVANSE 40 MG PO CHEW
1.0000 | CHEWABLE_TABLET | Freq: Every day | ORAL | 0 refills | Status: DC
  Filled 2020-06-10: qty 30, 30d supply, fill #0

## 2020-07-02 ENCOUNTER — Encounter: Payer: 59 | Admitting: Family Medicine

## 2020-07-03 ENCOUNTER — Telehealth: Payer: Self-pay | Admitting: Family Medicine

## 2020-07-03 DIAGNOSIS — Z111 Encounter for screening for respiratory tuberculosis: Secondary | ICD-10-CM

## 2020-07-03 NOTE — Telephone Encounter (Signed)
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

## 2020-07-03 NOTE — Telephone Encounter (Signed)
Pt scheduled for 6/27

## 2020-07-18 ENCOUNTER — Encounter (HOSPITAL_BASED_OUTPATIENT_CLINIC_OR_DEPARTMENT_OTHER): Payer: Self-pay

## 2020-07-18 ENCOUNTER — Telehealth (HOSPITAL_BASED_OUTPATIENT_CLINIC_OR_DEPARTMENT_OTHER): Payer: Self-pay

## 2020-07-18 NOTE — Addendum Note (Signed)
Addended by: Salina April on: 07/18/2020 11:36 AM   Modules accepted: Orders

## 2020-07-18 NOTE — Telephone Encounter (Signed)
Attempt made to contact Mary Steele is a 20 y.o. female re: New pt Pre appt call to collect history information. -Allergy -Medication -Confirm pharmacy -OB history   Pt was not available. LM on the VM for the patient to call back

## 2020-07-18 NOTE — Telephone Encounter (Signed)
Mary Steele is a 20 y.o. female was called and contacted re: New pt Pre appt call to collect history information. -Allergy -Medication -Confirm pharmacy -OB history   Pt was available.Chart was updated. Pt was notified to arrive 15 min early and we will need a urine sample when she arrives. Pt verbalized understanding.

## 2020-07-21 ENCOUNTER — Other Ambulatory Visit: Payer: Self-pay | Admitting: Family Medicine

## 2020-07-21 ENCOUNTER — Other Ambulatory Visit: Payer: Self-pay | Admitting: Internal Medicine

## 2020-07-21 ENCOUNTER — Other Ambulatory Visit: Payer: Self-pay

## 2020-07-21 ENCOUNTER — Other Ambulatory Visit (HOSPITAL_COMMUNITY): Payer: Self-pay

## 2020-07-21 ENCOUNTER — Other Ambulatory Visit: Payer: 59

## 2020-07-21 DIAGNOSIS — Z111 Encounter for screening for respiratory tuberculosis: Secondary | ICD-10-CM | POA: Diagnosis not present

## 2020-07-21 DIAGNOSIS — Z30011 Encounter for initial prescription of contraceptive pills: Secondary | ICD-10-CM

## 2020-07-21 DIAGNOSIS — N920 Excessive and frequent menstruation with regular cycle: Secondary | ICD-10-CM

## 2020-07-21 MED ORDER — TARINA 24 FE 1-20 MG-MCG(24) PO TABS
1.0000 | ORAL_TABLET | Freq: Every day | ORAL | 0 refills | Status: DC
  Filled 2020-07-21: qty 84, 84d supply, fill #0

## 2020-07-21 NOTE — Telephone Encounter (Signed)
She would need to have a med check with somebody here AFTER records are reviewed from their office (most recent visits, over the last year). This will NOT be done as part of the work-in physical being done tomorrow.

## 2020-07-22 ENCOUNTER — Other Ambulatory Visit (HOSPITAL_COMMUNITY): Payer: Self-pay

## 2020-07-22 NOTE — Telephone Encounter (Signed)
I have sent a mychart message to patient. Please deny medication

## 2020-07-23 ENCOUNTER — Encounter: Payer: 59 | Admitting: Family Medicine

## 2020-07-23 NOTE — Progress Notes (Signed)
Chief Complaint  Patient presents with   Annual Exam    Fasting annual exam, seeing GYN tomorrow-does not need pap or breast exam today. Would like TSH checked today. Has form for school that needs to be filled out-Tb blood work still pending.     Mary Steele is a 20 y.o. female who presents for a complete physical.  She has upcoming appt with GYN (07/25/20 with Dr. Sabra Heck)  She brings in form to be filled out for school.  Mother wants thyroid checked--looks larger, gained some weight, asking to have it checked again (examined, possibly bloodwork, if felt to be appropriate, per pt).  Denies symptoms, always has dry skin. No fatigue, changes in hair/skin/bowels/energy. Some weight gain, but reports she has been less consistent with exercise.  She was last seen by me with complaints of tachycardia in 08/2019.  Thyroid tests were normal. Cardiology eval was recommended, poss stimulant use contributing.  She reports that the tachycardia resolved after she was taken off Adderall.  She is currently on Vyvanse 49m chews. She reports that she usually only takes 1/2 chew daily.  Takes full chew if studying, when needed.  She has ADD, and has [facility]. She is hoping to get meds refilled from this office, difficulty getting back as often as they recommend due to being at school.  She was asked to request records for review, to see if this is something we can manage going forward or not. She reports that she has plenty of her medication left at home, not needing a refill now, due to only taking 1/2 chew most days. She reports that her dad works at pharmacy and sometimes will pick up refill before she needs it.   Immunization History  Administered Date(s) Administered   DTaP 02/06/1999, 04/15/1999, 06/17/1999, 10/11/2000, 08/05/2004   HPV 9-valent 09/10/2010, 11/10/2010, 03/17/2011   Hepatitis A 04/27/2006, 04/05/2007   Hepatitis B 101/21/2000 02/06/1999,  09/25/1999   HiB (PRP-OMP) 02/06/1999, 04/15/1999, 06/17/1999, 10/11/2000   IPV 02/06/1999, 04/15/1999, 09/25/1999, 08/05/2004   Influenza,inj,Quad PF,6+ Mos 12/15/2016, 12/07/2017   Influenza-Unspecified 11/10/2010, 11/23/2012, 12/15/2016, 09/27/2019   MMR 01/08/2000, 08/05/2004   Meningococcal B, OMV 08/04/2017, 09/07/2017   Meningococcal Conjugate 09/10/2010, 08/27/2015   PFIZER(Purple Top)SARS-COV-2 Vaccination 03/16/2019, 04/09/2019, 01/23/2020   Pneumococcal Conjugate-13 02/06/1999, 04/15/1999, 06/17/1999, 01/08/2000   Tdap 09/10/2010   Varicella 03/04/2000, 04/27/2006   Social history: Lives with her parents. 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 Alcohol: 1-2 drinks at a time, up to 4/week. Diet: well-rounded, lots of fruits and vegetables, cheese and crackers. Limited milk/yogurt, trying to get more. 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 During the school year, exercises at apartment gym 3-4 days/week (runs 1-2 miles, [employer] workout (weights)).  Last Menstrual cycle: 6/4-9, regular. Sexual Activity: not currently Lifetime partners: 1 Contraception: OCP's, condoms Breast exams: done regularly Last Dental Exam: every 6 months  Last Eye Exam: years ago    Lipids: unsure if ever checked, no records.   PMH, PSH, SH and FH were reviewed and updated.   Outpatient Encounter Medications as of 07/24/2020  Medication Sig Note   cetirizine (ZYRTEC) 10 MG tablet Take 10 mg by mouth daily.    Lisdexamfetamine Dimesylate (VYVANSE) 40 MG CHEW Chew 1 tablet by mouth once a day    Norethindrone Acetate-Ethinyl Estrad-FE (TARINA 24 FE) 1-20  MG-MCG(24) tablet Take 1 tablet by mouth daily.    [DISCONTINUED] Lisdexamfetamine Dimesylate 40 MG CHEW CHEW 1 TABLET BY MOUTH ONCE A DAY    [DISCONTINUED] Lisdexamfetamine Dimesylate 40 MG CHEW CHEW 1 TABLET BY MOUTH ONCE A  DAY    [DISCONTINUED] Lisdexamfetamine Dimesylate 40 MG CHEW CHEW 1 TABLET BY MOUTH ONCE A DAY    [DISCONTINUED] Lisdexamfetamine Dimesylate 40 MG CHEW CHEW AND SWALLOW 1 TABLET BY MOUTH DAILY    [DISCONTINUED] VYVANSE 40 MG CHEW Chew 1 tablet by mouth daily. 09/03/2019: 36m BID   No facility-administered encounter medications on file as of 07/24/2020.   Sporadically takes MVI, and also a C&D gummy.  No Known Allergies   ROS:  The patient denies anorexia, fever, headaches,  vision changes, decreased hearing, ear pain, sore throat, breast concerns, chest pain, palpitations, dizziness, syncope, dyspnea on exertion, cough, swelling, nausea, vomiting, diarrhea, constipation, abdominal pain, melena, hematochezia, indigestion/heartburn, hematuria, incontinence, dysuria, irregular menstrual cycles, vaginal discharge, odor or itch, genital lesions, joint pains, numbness, tingling, weakness, tremor, suspicious skin lesions, depression, anxiety, abnormal bleeding/bruising, or enlarged lymph nodes. No longer having any tachycardia. Some weight gain (attribute to less consistent exercise; denies any other thyroid-related symptoms).   PHYSICAL EXAM:  BP 120/76   Pulse 88   Ht '5\' 6"'  (1.676 m)   Wt 170 lb (77.1 kg)   BMI 27.44 kg/m   Wt Readings from Last 3 Encounters:  07/24/20 170 lb (77.1 kg)  02/04/20 163 lb (73.9 kg)  01/24/20 164 lb 3.2 oz (74.5 kg)    General Appearance:   Alert, cooperative, no distress, appears stated age  Head:   Normocephalic, without obvious abnormality, atraumatic  Eyes:   PERRL, conjunctiva/corneas clear, EOM's intact  Ears:   Normal TM's and external ear canals  Nose:   Not examined, wearing mask due to COVID-19 pandemic  Throat:   Not examined, wearing mask due to COVID-19 pandemic  Neck:   Supple, no lymphadenopathy;  thyroid:  noc enlargement/ tenderness/nodules; no JVD  Back:    Spine nontender, no curvature, ROM normal, no CVA     tenderness  Lungs:      Clear to auscultation bilaterally without wheezes, rales or     ronchi; respirations unlabored  Chest Wall:   No tenderness or deformity   Heart:   Regular rate and rhythm, S1 and S2 normal, no murmur, rub or gallop  Breast Exam:    Deferred to GYN  Abdomen:     Soft, non-tender, nondistended, normoactive bowel sounds,   no masses, no hepatosplenomegaly  Genitalia:    Deferred to GYN  Rectal:   Not performed due to age<40 and no related complaints  Extremities:   No clubbing, cyanosis or edema  Pulses:   2+ and symmetric all extremities  Skin:   Skin color, texture, turgor normal, no rashes or lesions  Lymph nodes:   Cervical, supraclavicular, and axillary nodes normal  Neurologic:   Normal strength, sensation and gait; reflexes 2+ and symmetric throughout                                Psych:   Normal mood, affect, hygiene and grooming.        ASSESSMENT/PLAN:  Annual physical exam - Plan: Visual acuity screening, POCT Urinalysis DIP (Proadvantage Device), VITAMIN D 25 Hydroxy (Vit-D Deficiency, Fractures), Lipid panel, HIV Antibody (routine testing w rflx), RPR, Hepatitis C antibody  Family  history of heart disease in female family member before age 34 - Plan: Lipid panel  Screen for STD (sexually transmitted disease) - Plan: HIV Antibody (routine testing w rflx), RPR, Hepatitis C antibody  Need for Tdap vaccination - Plan: Tdap vaccine greater than or equal to 7yo IM  Has appt with GYN later this week (due for pap smear, age 62).  Chlamydia--to be done with pap by GYN tomorrow  RPR, HIV, Hep C Ab, lipid,  C-met and CBC normal 01/24/2020  FFO for school, Quantiferon Gold+ negative  F/u 1 year for CPE Can schedule med check after ADD records reviewed, prior to needing any refill.

## 2020-07-24 ENCOUNTER — Ambulatory Visit (INDEPENDENT_AMBULATORY_CARE_PROVIDER_SITE_OTHER): Payer: 59 | Admitting: Family Medicine

## 2020-07-24 ENCOUNTER — Encounter: Payer: Self-pay | Admitting: Family Medicine

## 2020-07-24 ENCOUNTER — Other Ambulatory Visit: Payer: Self-pay

## 2020-07-24 VITALS — BP 120/76 | HR 88 | Ht 66.0 in | Wt 170.0 lb

## 2020-07-24 DIAGNOSIS — Z0000 Encounter for general adult medical examination without abnormal findings: Secondary | ICD-10-CM | POA: Diagnosis not present

## 2020-07-24 DIAGNOSIS — Z23 Encounter for immunization: Secondary | ICD-10-CM | POA: Diagnosis not present

## 2020-07-24 DIAGNOSIS — Z8249 Family history of ischemic heart disease and other diseases of the circulatory system: Secondary | ICD-10-CM | POA: Diagnosis not present

## 2020-07-24 DIAGNOSIS — Z113 Encounter for screening for infections with a predominantly sexual mode of transmission: Secondary | ICD-10-CM

## 2020-07-24 LAB — QUANTIFERON-TB GOLD PLUS
QuantiFERON Mitogen Value: >10 IU/mL
QuantiFERON Nil Value: 0 IU/mL
QuantiFERON TB1 Ag Value: 0.01 IU/mL
QuantiFERON TB2 Ag Value: 0 IU/mL
QuantiFERON-TB Gold Plus: NEGATIVE

## 2020-07-24 LAB — POCT URINALYSIS DIP (PROADVANTAGE DEVICE)
Blood, UA: NEGATIVE
Glucose, UA: NEGATIVE mg/dL
Ketones, POC UA: NEGATIVE mg/dL
Leukocytes, UA: NEGATIVE
Nitrite, UA: NEGATIVE
Protein Ur, POC: 30 mg/dL — AB
Specific Gravity, Urine: 1.02
Urobilinogen, Ur: NEGATIVE
pH, UA: 6.5 (ref 5.0–8.0)

## 2020-07-24 NOTE — Patient Instructions (Signed)
Well Child Safety, Young Adult This sheet provides general safety recommendations. Talk with a health careprovider if you have any questions. Home safety Make sure your home or apartment has smoke detectors and carbon monoxide detectors. Test them once a month. Change their batteries every year. If you keep guns and ammunition in the home, make sure they are stored separately and locked away. Make your home a tobacco-free and drug-free environment. Motor vehicle safety  Wear a seat belt whenever you drive or ride in a vehicle. Do not text, talk, or use your phone or other mobile devices while driving. Do not drive when you are tired. If you feel like you may fall asleep while driving, pull over at a safe location and take a break or switch drivers. Do not drive after drinking or using drugs. Plan for a designated driver or another way to go home. Do not ride in a car with someone who has been using drugs or alcohol. Do not ride in the bed or cargo area of a pickup truck. Sun safety  Use broad-spectrum sunscreen that protects against UVA and UVB radiation (SPF 15 or higher). Put on sunscreen 15-30 minutes before going outside. Reapply sunscreen every 2 hours, or more often if you get wet or if you are sweating. Use enough sunscreen to cover all exposed areas. Rub it in well. Wear sunglasses when you are out in the sun. Do not use tanning beds. Tanning beds are just as harmful for your skin as the sun. Water safety Never swim alone. Only swim in designated areas. Do not swim in areas where you do not know the water conditions or where underwater hazards are located. Personal safety Do not use tobacco, drugs, anabolic steroids, or diet pills. Do not drink or use drugs while swimming, boating, riding a bike or motorcycle, or using heavy machinery. Do not drink heavily (binge drink). Your brain is still developing, and alcohol can affect your brain development. Wear protective gear for  sports and other physical activities, such as a helmet, mouth guard, eye protection, wrist guards, elbow pads, and knee pads. Wear a helmet when biking, riding a motorcycle or all-terrain vehicle (ATV), skateboarding, skiing, or snowboarding. If you are sexually active, practice safe sex. Use a condom or other form of birth control (contraception) in order to prevent pregnancy and STIs (sexually transmitted infections). If you do not wish to become pregnant, use a form of birth control. If you plan to become pregnant, see your health care provider for a preconception visit. Avoid risky situations or situations where you do not feel safe. Call for help if you find yourself in an unsafe situation. Neverleave a party or event alone without telling a friend that you are leaving. Never leave with a stranger. Neveraccept a drink from a stranger if you do not know where the drink came from. Do not misuse medicines. This means that you should not take a medicine other than how it is prescribed and you should not take someone else's medicine. Learn to manage conflict without using violence. Avoid people who suggest unsafe or harmful behavior, and avoid unhealthy romantic relationships or friendships where you do not feel respected. No one has the right to pressure you into any activity that makes you feel uncomfortable. If others make you feel unsafe, you can: Ask for help from your parents or guardians, your health care provider, or other trusted adults like a Runner, broadcasting/film/video, coach, or counselor. Call the [employer] Violence Hotline at (534)229-9501  or go online: www.thehotline.org General instructions Protect your hearing and avoid exposure to loud music or noises by: Wearing ear protection when you are in a noisy environment (while using loud machinery, like a lawn mower, or at concerts). Making sure that the volume is not too loud when listening to music in the car or through headphones. Avoid tattoos and  body piercings. Tattoos and body piercings can get infected. Where to find more information: American Academy of Pediatrics: www.healthychildren.org Centers for Disease Control and Prevention: FootballExhibition.com.br Summary Protect yourself from sun exposure by using broad-spectrum sunscreen that protects against UVA and UVB radiation (SPF 15 or higher). Wear appropriate protective gear when playing sports and doing other activities. Gear may include a helmet, mouth guard, eye protection, wrist guards, and elbow and knee pads. Be safe when driving or riding in vehicles. While driving: Wear a seat belt. Do not use your mobile device. Do not drink or use drugs. Always be aware of your surroundings. Avoid risky situations or places where you feel unsafe. Avoid relationships or friendships in which you do not feel respected. It is okay to ask for help from your parents or guardians, your health care provider, or other trusted adults like a Runner, broadcasting/film/video, coach, or counselor. This information is not intended to replace advice given to you by your health care provider. Make sure you discuss any questions you have with your healthcare provider. Document Revised: 12/28/2019 Document Reviewed: 12/28/2019 Elsevier Patient Education  2022 Elsevier Inc.   Calcium Content in Foods Calcium is the most abundant mineral in the body. Most of the body's calcium supply is stored in bones and teeth. Calcium helps many parts of the body function normally, including: Blood and blood vessels. Nerves. Hormones. Muscles. Bones and teeth. When your calcium stores are low, you may be at risk for low bone mass, bone loss, and broken bones (fractures). When you get enough calcium, it helps to support strong bones and teeththroughout your life. Calcium is especially important for: Children during growth spurts. Girls during adolescence. Women who are pregnant or breastfeeding. Women after their menstrual cycle stops  (postmenopause). Women whose menstrual cycle has stopped due to anorexia nervosa or regular intense exercise. People who cannot eat or digest dairy products. Vegans. Recommended daily amounts of calcium: Women (ages 58 to 56): 1,000 mg per day. Women (ages 44 and older): 1,200 mg per day. Men (ages 104 to 40): 1,000 mg per day. Men (ages 75 and older): 1,200 mg per day. Women (ages 25 to 74): 1,300 mg per day. Men (ages 2 to 8): 1,300 mg per day. General information Eat foods that are high in calcium. Try to get most of your calcium from food. Some people may benefit from taking calcium supplements. Check with your health care provider or diet and nutrition specialist (dietitian) before starting any calcium supplements. Calcium supplements may interact with certain medicines. Too much calcium may cause other health problems, such as constipation and kidney stones. For the body to absorb calcium, it needs vitamin D. Sources of vitamin D include: Skin exposure to direct sunlight. Foods, such as egg yolks, liver, mushrooms, saltwater fish, and fortified milk. Vitamin D supplements. Check with your health care provider or dietitian before starting any vitamin D supplements. What foods are high in calcium?  Foods that are high in calcium contain more than 100 milligrams per serving. Fruits Fortified orange juice or other fruit juice, 300 mg per 8 oz serving. Vegetables Collard greens, 360 mg per 8  oz serving. Kale, 100 mg per 8 oz serving. Bok choy, 160 mg per 8 oz serving. Grains Fortified ready-to-eat cereals, 100 to 1,000 mg per 8 oz serving. Fortified frozen waffles, 200 mg in 2 waffles. Oatmeal, 140 mg in 1 cup. Meats and other proteins Sardines, canned with bones, 325 mg per 3 oz serving. Salmon, canned with bones, 180 mg per 3 oz serving. Canned shrimp, 125 mg per 3 oz serving. Baked beans, 160 mg per 4 oz serving. Tofu, firm, made with calcium sulfate, 253 mg per 4 oz  serving. Dairy Yogurt, plain, low-fat, 310 mg per 6 oz serving. Nonfat milk, 300 mg per 8 oz serving. American cheese, 195 mg per 1 oz serving. Cheddar cheese, 205 mg per 1 oz serving. Cottage cheese 2%, 105 mg per 4 oz serving. Fortified soy, rice, or almond milk, 300 mg per 8 oz serving. Mozzarella, part skim, 210 mg per 1 oz serving. The items listed above may not be a complete list of foods high in calcium. Actual amounts of calcium may be different depending on processing. Contact a dietitian for more information. What foods are lower in calcium? Foods that are lower in calcium contain 50 mg or less per serving. Fruits Apple, about 6 mg. Banana, about 12 mg. Vegetables Lettuce, 19 mg per 2 oz serving. Tomato, about 11 mg. Grains Rice, 4 mg per 6 oz serving. Boiled potatoes, 14 mg per 8 oz serving. White bread, 6 mg per slice. Meats and other proteins Egg, 27 mg per 2 oz serving. Red meat, 7 mg per 4 oz serving. Chicken, 17 mg per 4 oz serving. Fish, cod, or trout, 20 mg per 4 oz serving. Dairy Cream cheese, regular, 14 mg per 1 Tbsp serving. Brie cheese, 50 mg per 1 oz serving. Parmesan cheese, 70 mg per 1 Tbsp serving. The items listed above may not be a complete list of foods lower in calcium. Actual amounts of calcium may be different depending on processing. Contact a dietitian for more information. Summary Calcium is an important mineral in the body because it affects many functions. Getting enough calcium helps support strong bones and teeth throughout your life. Try to get most of your calcium from food. Calcium supplements may interact with certain medicines. Check with your health care provider or dietitian before starting any calcium supplements. This information is not intended to replace advice given to you by your health care provider. Make sure you discuss any questions you have with your healthcare provider. Document Revised: 05/09/2019 Document Reviewed:  05/09/2019 Elsevier Patient Education  2022 [employer].

## 2020-07-25 ENCOUNTER — Other Ambulatory Visit (HOSPITAL_COMMUNITY)
Admission: RE | Admit: 2020-07-25 | Discharge: 2020-07-25 | Disposition: A | Discharge status: Home / Self Care | Payer: 59 | Source: Ambulatory Visit | Attending: Obstetrics & Gynecology | Admitting: Obstetrics & Gynecology

## 2020-07-25 ENCOUNTER — Encounter (HOSPITAL_BASED_OUTPATIENT_CLINIC_OR_DEPARTMENT_OTHER): Payer: Self-pay | Admitting: Obstetrics & Gynecology

## 2020-07-25 ENCOUNTER — Ambulatory Visit (HOSPITAL_BASED_OUTPATIENT_CLINIC_OR_DEPARTMENT_OTHER): Payer: 59 | Admitting: Obstetrics & Gynecology

## 2020-07-25 ENCOUNTER — Other Ambulatory Visit (HOSPITAL_COMMUNITY): Payer: Self-pay

## 2020-07-25 VITALS — BP 125/82 | HR 83 | Ht 66.5 in | Wt 170.2 lb

## 2020-07-25 DIAGNOSIS — Z30011 Encounter for initial prescription of contraceptive pills: Secondary | ICD-10-CM

## 2020-07-25 DIAGNOSIS — Z113 Encounter for screening for infections with a predominantly sexual mode of transmission: Secondary | ICD-10-CM

## 2020-07-25 DIAGNOSIS — Z124 Encounter for screening for malignant neoplasm of cervix: Secondary | ICD-10-CM

## 2020-07-25 DIAGNOSIS — Z01419 Encounter for gynecological examination (general) (routine) without abnormal findings: Secondary | ICD-10-CM

## 2020-07-25 DIAGNOSIS — N920 Excessive and frequent menstruation with regular cycle: Secondary | ICD-10-CM | POA: Diagnosis not present

## 2020-07-25 LAB — LIPID PANEL
Chol/HDL Ratio: 3 ratio (ref 0.0–4.4)
Cholesterol, Total: 210 mg/dL — ABNORMAL HIGH (ref 100–199)
HDL: 70 mg/dL (ref >39)
LDL Chol Calc (NIH): 129 mg/dL — ABNORMAL HIGH (ref 0–99)
Triglycerides: 63 mg/dL (ref 0–149)
VLDL Cholesterol Cal: 11 mg/dL (ref 5–40)

## 2020-07-25 LAB — VITAMIN D 25 HYDROXY (VIT D DEFICIENCY, FRACTURES): Vit D, 25-Hydroxy: 40.7 ng/mL (ref 30.0–100.0)

## 2020-07-25 LAB — HIV ANTIBODY (ROUTINE TESTING W REFLEX): HIV Screen 4th Generation wRfx: NONREACTIVE

## 2020-07-25 LAB — RPR: RPR Ser Ql: NONREACTIVE

## 2020-07-25 LAB — HEPATITIS C ANTIBODY: Hep C Virus Ab: <0.1 s/co ratio (ref 0.0–0.9)

## 2020-07-25 MED ORDER — TARINA 24 FE 1-20 MG-MCG(24) PO TABS
1.0000 | ORAL_TABLET | Freq: Every day | ORAL | 3 refills | Status: DC
  Filled 2020-07-25 – 2020-10-07 (×2): qty 84, 84d supply, fill #0
  Filled 2021-01-03: qty 84, 84d supply, fill #1

## 2020-07-25 NOTE — Progress Notes (Signed)
20 y.o. G0P0000 Single White or Caucasian female here for annual exam/next patient exam.  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  Boyfriend is in the [employer] and is stationed in [place].    H/o being SA but not currently.  On OCPs.    Patient's last menstrual period was 06/28/2020 (exact date).          Sexually active: Yes.    The current method of family planning is OCP (estrogen/progesterone).    Exercising: Yes.    Smoker:  no  Health Maintenance: Pap:  plan today TDaP:  07/24/20 Hep C testing: 07/24/2020 Screening Labs: done yesterday   reports that she has never smoked. She has never used smokeless tobacco. She reports current alcohol use. She reports that she does not use drugs.  Past Medical History:  Diagnosis Date   Acne    treated with accutane   ADHD    Menorrhagia with regular cycle     Past Surgical History:  Procedure Laterality Date   WISDOM TOOTH EXTRACTION      Current Outpatient Medications  Medication Sig Dispense Refill   cetirizine (ZYRTEC) 10 MG tablet Take 10 mg by mouth daily.     Lisdexamfetamine Dimesylate (VYVANSE) 40 MG CHEW Chew 1 tablet by mouth once a day 30 tablet 0   Norethindrone Acetate-Ethinyl Estrad-FE (TARINA 24 FE) 1-20 MG-MCG(24) tablet Take 1 tablet by mouth daily. 84 tablet 0   No current facility-administered medications for this visit.    Family History  Problem Relation Age of Onset   Thyroid disease Mother    Thyroid cancer Mother    Cervical cancer Mother    Heart attack Father 48    Review of Systems  All other systems reviewed and are negative.  Exam:   BP 125/82 (BP Location: Left Arm, Patient Position: Sitting, Cuff Size: Small)   Pulse 83   Ht 5' 6.5" (1.689 m)   Wt 170 lb 3.2 oz (77.2 kg)   LMP 06/28/2020 (Exact Date)   BMI 27.06 kg/m   Height: 5' 6.5" (168.9 cm)  General appearance: alert, cooperative and appears stated age Head: Normocephalic, without obvious abnormality, atraumatic Neck: no  adenopathy, supple, symmetrical, trachea midline and thyroid normal to inspection and palpation Lungs: clear to auscultation bilaterally Breasts: normal appearance, no masses or tenderness Heart: regular rate and rhythm Abdomen: soft, non-tender; bowel sounds normal; no masses,  no organomegaly Extremities: extremities normal, atraumatic, no cyanosis or edema Skin: Skin color, texture, turgor normal. No rashes or lesions Lymph nodes: Cervical, supraclavicular, and axillary nodes normal. No abnormal inguinal nodes palpated Neurologic: Grossly normal   Pelvic: External genitalia:  no lesions              Urethra:  normal appearing urethra with no masses, tenderness or lesions              Bartholins and Skenes: normal                 Vagina: normal appearing vagina with normal color and no discharge, no lesions              Cervix: no lesions              Pap taken: Yes.   Bimanual Exam:  Uterus:  normal size, contour, position, consistency, mobility, non-tender              Adnexa: normal adnexa and no mass, fullness, tenderness  Rectovaginal: Confirms               Anus:  normal sphincter tone, no lesions  Chaperone, Ina Homes, CMA, was present for exam.  Assessment/Plan: 1. Well woman exam with routine gynecological exam - pap obtained today - breast and colon screening discussed - vaccines reviewed  2. Cervical cancer screening - Cytology - PAP( [place])  3. Menorrhagia with regular cycle - Norethindrone Acetate-Ethinyl Estrad-FE (TARINA 24 FE) 1-20 MG-MCG(24) tablet; Take 1 tablet by mouth daily.  Dispense: 84 tablet; Refill: 3  4. Screening examination for STD (sexually transmitted disease) - GC/Chl testing obtained today

## 2020-07-30 ENCOUNTER — Other Ambulatory Visit (HOSPITAL_BASED_OUTPATIENT_CLINIC_OR_DEPARTMENT_OTHER): Payer: Self-pay | Admitting: *Deleted

## 2020-07-30 DIAGNOSIS — B379 Candidiasis, unspecified: Secondary | ICD-10-CM

## 2020-07-30 LAB — CYTOLOGY - PAP
Chlamydia: NEGATIVE
Comment: NEGATIVE
Comment: NORMAL
Diagnosis: NEGATIVE
Neisseria Gonorrhea: NEGATIVE

## 2020-07-30 MED ORDER — FLUCONAZOLE 150 MG PO TABS: 150.0000 mg | ORAL_TABLET | Freq: Once | ORAL | 0 refills | Status: AC

## 2020-09-03 ENCOUNTER — Other Ambulatory Visit (HOSPITAL_COMMUNITY): Payer: Self-pay

## 2020-09-03 DIAGNOSIS — Z79899 Other long term (current) drug therapy: Secondary | ICD-10-CM | POA: Diagnosis not present

## 2020-09-03 DIAGNOSIS — F902 Attention-deficit hyperactivity disorder, combined type: Secondary | ICD-10-CM | POA: Diagnosis not present

## 2020-09-03 MED ORDER — VYVANSE 40 MG PO CHEW
1.0000 | CHEWABLE_TABLET | Freq: Every day | ORAL | 0 refills | Status: DC
  Filled 2020-09-03: qty 30, 30d supply, fill #0

## 2020-09-25 ENCOUNTER — Other Ambulatory Visit (HOSPITAL_COMMUNITY): Payer: Self-pay

## 2020-10-07 ENCOUNTER — Other Ambulatory Visit (HOSPITAL_COMMUNITY): Payer: Self-pay

## 2020-10-08 ENCOUNTER — Other Ambulatory Visit (HOSPITAL_COMMUNITY): Payer: Self-pay

## 2020-10-09 ENCOUNTER — Other Ambulatory Visit (HOSPITAL_COMMUNITY): Payer: Self-pay

## 2020-10-10 ENCOUNTER — Other Ambulatory Visit (HOSPITAL_COMMUNITY): Payer: Self-pay

## 2020-10-10 MED ORDER — CARESTART COVID-19 HOME TEST VI KIT
PACK | 0 refills | Status: DC
  Filled 2020-10-10: qty 4, 4d supply, fill #0

## 2020-10-13 ENCOUNTER — Other Ambulatory Visit (HOSPITAL_COMMUNITY): Payer: Self-pay

## 2020-10-13 MED ORDER — VYVANSE 40 MG PO CHEW
CHEWABLE_TABLET | ORAL | 0 refills | Status: DC
  Filled 2020-10-13: qty 30, 30d supply, fill #0

## 2020-10-14 ENCOUNTER — Other Ambulatory Visit (HOSPITAL_COMMUNITY): Payer: Self-pay

## 2020-10-30 DIAGNOSIS — Z20822 Contact with and (suspected) exposure to covid-19: Secondary | ICD-10-CM | POA: Diagnosis not present

## 2020-10-30 DIAGNOSIS — R051 Acute cough: Secondary | ICD-10-CM | POA: Diagnosis not present

## 2020-10-30 DIAGNOSIS — J069 Acute upper respiratory infection, unspecified: Secondary | ICD-10-CM | POA: Diagnosis not present

## 2020-10-30 DIAGNOSIS — B349 Viral infection, unspecified: Secondary | ICD-10-CM | POA: Diagnosis not present

## 2020-12-11 ENCOUNTER — Other Ambulatory Visit (HOSPITAL_COMMUNITY): Payer: Self-pay

## 2020-12-12 ENCOUNTER — Other Ambulatory Visit (HOSPITAL_COMMUNITY): Payer: Self-pay

## 2020-12-16 ENCOUNTER — Other Ambulatory Visit (HOSPITAL_COMMUNITY): Payer: Self-pay

## 2020-12-17 ENCOUNTER — Other Ambulatory Visit (HOSPITAL_COMMUNITY): Payer: Self-pay

## 2020-12-17 MED ORDER — VYVANSE 40 MG PO CHEW
CHEWABLE_TABLET | ORAL | 0 refills | Status: DC
  Filled 2020-12-17: qty 30, 30d supply, fill #0

## 2021-01-05 ENCOUNTER — Other Ambulatory Visit (HOSPITAL_COMMUNITY): Payer: Self-pay

## 2021-01-05 MED ORDER — BLISOVI 24 FE 1-20 MG-MCG(24) PO TABS
ORAL_TABLET | ORAL | 2 refills | Status: DC
  Filled 2021-01-05: qty 84, 84d supply, fill #0
  Filled 2021-03-06: qty 28, 28d supply, fill #1

## 2021-01-29 ENCOUNTER — Other Ambulatory Visit (HOSPITAL_COMMUNITY): Payer: Self-pay

## 2021-01-29 DIAGNOSIS — Z79899 Other long term (current) drug therapy: Secondary | ICD-10-CM | POA: Diagnosis not present

## 2021-01-29 DIAGNOSIS — F902 Attention-deficit hyperactivity disorder, combined type: Secondary | ICD-10-CM | POA: Diagnosis not present

## 2021-01-29 MED ORDER — VYVANSE 40 MG PO CHEW
1.0000 | CHEWABLE_TABLET | Freq: Every day | ORAL | 0 refills | Status: DC
  Filled 2021-01-29 – 2021-03-06 (×2): qty 30, 30d supply, fill #0

## 2021-01-30 DIAGNOSIS — F902 Attention-deficit hyperactivity disorder, combined type: Secondary | ICD-10-CM | POA: Diagnosis not present

## 2021-03-06 ENCOUNTER — Other Ambulatory Visit (HOSPITAL_COMMUNITY): Payer: Self-pay

## 2021-03-06 MED ORDER — NORETHIN ACE-ETH ESTRAD-FE 1-20 MG-MCG(24) PO TABS
ORAL_TABLET | ORAL | 1 refills | Status: DC
  Filled 2021-03-06: qty 28, 28d supply, fill #0
  Filled 2021-04-27: qty 28, 28d supply, fill #1
  Filled 2021-05-23: qty 28, 28d supply, fill #2
  Filled 2021-06-20: qty 28, 28d supply, fill #3
  Filled 2021-07-17: qty 28, 28d supply, fill #4

## 2021-03-10 ENCOUNTER — Other Ambulatory Visit (HOSPITAL_COMMUNITY): Payer: Self-pay

## 2021-04-27 ENCOUNTER — Other Ambulatory Visit (HOSPITAL_COMMUNITY): Payer: Self-pay

## 2021-04-29 ENCOUNTER — Other Ambulatory Visit (HOSPITAL_COMMUNITY): Payer: Self-pay

## 2021-04-29 MED ORDER — VYVANSE 40 MG PO CHEW
CHEWABLE_TABLET | ORAL | 0 refills | Status: DC
  Filled 2021-04-29: qty 30, 30d supply, fill #0

## 2021-05-25 ENCOUNTER — Other Ambulatory Visit (HOSPITAL_COMMUNITY): Payer: Self-pay

## 2021-06-23 ENCOUNTER — Other Ambulatory Visit (HOSPITAL_COMMUNITY): Payer: Self-pay

## 2021-06-24 ENCOUNTER — Ambulatory Visit (INDEPENDENT_AMBULATORY_CARE_PROVIDER_SITE_OTHER): Payer: 59 | Admitting: Family Medicine

## 2021-06-24 ENCOUNTER — Encounter: Payer: Self-pay | Admitting: Family Medicine

## 2021-06-24 VITALS — BP 122/88 | HR 92 | Temp 98.4°F | Ht 66.0 in | Wt 163.6 lb

## 2021-06-24 DIAGNOSIS — H6693 Otitis media, unspecified, bilateral: Secondary | ICD-10-CM | POA: Diagnosis not present

## 2021-06-24 DIAGNOSIS — L309 Dermatitis, unspecified: Secondary | ICD-10-CM

## 2021-06-24 DIAGNOSIS — Z111 Encounter for screening for respiratory tuberculosis: Secondary | ICD-10-CM

## 2021-06-24 NOTE — Progress Notes (Signed)
Chief Complaint  Patient presents with   Ear Pain    Did tele-doc and diagnosed with double ear infection, having drainage. Would like for you to take a look. Also needs quaniferon for nursing school.    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  She reports that her mother was also having her ask about checking her thyroid, since she seemed "sluggish lately" per mother. 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  She was recently sick (see below).  She denies any change to hair/skin/bowels/weight/nails  Eczema patch below the left lip. She reports this has been there for a year, flared recently.  She tried HC cream without much benefit. She has been using Aquaphor stick, and it is improving significantly. No longer scaly/flaky, just a little raised. Prev had eczema flares on arms with exams.  Last week she started with L ear pain, woke up with drainage.  Then the R ear started hurting.  Over the weekend pain got worse. Put on amoxil, atrovent nasal spray by tele-health doc.  Has also been taking dayquil/nyquil.  Postnasal drainage and congestion started after being on ABX. Nasal drainage is clear. Denies sinus pain. Slight PND, causing slight cough. No shortness of breath She had chills and body aches prior to ABX, none since. She reports having a negative COVID test. Not currently sexually active (fiance in [place]). She is going to visit him soon, will be snorkeling.   PMH, PSH, SH reviewed  Outpatient Encounter Medications as of 06/24/2021  Medication Sig Note   amoxicillin (AMOXIL) 875 MG tablet SMARTSIG:1 Tablet(s) By Mouth Every 12 Hours    cetirizine (ZYRTEC) 10 MG tablet Take 10 mg by mouth daily.    ibuprofen (ADVIL) 800 MG tablet Take 800 mg by mouth every 8 (eight) hours as needed.    ipratropium (ATROVENT) 0.06 % nasal spray Place 2 sprays into both  nostrils 4 (four) times daily.    Lisdexamfetamine Dimesylate (VYVANSE) 40 MG CHEW Chew 1 tablet by mouth daily    Norethindrone Acetate-Ethinyl Estrad-FE (LOESTRIN 24 FE) 1-20 MG-MCG(24) tablet Take 1 tablet by mouth daily    Pseudoeph-Doxylamine-DM-APAP (NYQUIL PO) Take 2 tablets by mouth at bedtime. 06/24/2021: Last dose 11pm   Pseudoephedrine-APAP-DM (DAYQUIL PO) Take 2 tablets by mouth as needed. 06/24/2021: Last dose 7am   [DISCONTINUED] COVID-19 At Home Antigen Test (CARESTART COVID-19 HOME TEST) KIT Use as directed    No facility-administered encounter medications on file as of 06/24/2021.   No Known Allergies  ROS:  no further f/c/body aches. Ear pain has resolved.  Some congestion, PND.  Denies sinus pain, significant sore throat, rare cough (from drainage), no shortness of breath.  No n/v/d. No change in hair/skin/nails/mood/weight. See HPI   PHYSICAL EXAM:  BP 122/88   Pulse 92   Temp 98.4 F (36.9 C) (Tympanic)   Ht _0  (1.676 m)   Wt 163 lb 9.6 oz (74.2 kg)   LMP 06/10/2021 (Exact Date)   BMI 26.41 kg/m   Wt Readings from Last 3 Encounters:  06/24/21 163 lb 9.6 oz (74.2 kg)  07/25/20 170 lb 3.2 oz (77.2 kg)  07/24/20 170 lb (77.1 kg)   Pleasant, well-appearing female in no distress HEENT: conjunctiva and sclera are clear, EOMI.  Slightly raised area below L lip--smooth, no flaking, erythema. TM's and EAC's normal, no rupture Nasal mucosa mildly edematous, no erythema  or purulence. Sinuses nontender OP--tonsils are mild-mod enlarged bilaterally, slight white exudate on top of R tonsil Neck: no lymphadenopathy Heart: regular rate and rhythm Lungs: clear bilaterally   ASSESSMENT/PLAN:  Screening-pulmonary TB - needed for nursing school - Plan: QuantiFERON-TB Gold Plus  Bilateral otitis media, unspecified otitis media type - on amoxil from telehealth. Reassured of normal exam today, no e/o TM rupture. To complete course of ABX. ?poss viral URI given appearance  of tonsils  Eczema, unspecified type - improving with use of Aquaphor  Discussed mother's concerns regarding thyroid.  Test will not dx cancer; exam normal. No sx to suggested over- or under- active. Pt fine with NOT checking TSH today.

## 2021-06-29 ENCOUNTER — Telehealth: Payer: Self-pay | Admitting: *Deleted

## 2021-06-29 DIAGNOSIS — Z111 Encounter for screening for respiratory tuberculosis: Secondary | ICD-10-CM

## 2021-06-29 LAB — QUANTIFERON-TB GOLD PLUS

## 2021-06-29 NOTE — Telephone Encounter (Signed)
Left message asking patient to please call and schedule [facility] to come back in for re-draw on Quant Gold TB-lab could not process. Future order placed.

## 2021-06-30 NOTE — Telephone Encounter (Signed)
Pt is having a drug test for school done tomorrow at [facility] and they are going to see if they can draw it there

## 2021-07-01 ENCOUNTER — Other Ambulatory Visit (HOSPITAL_COMMUNITY): Payer: Self-pay

## 2021-07-01 ENCOUNTER — Other Ambulatory Visit: Payer: Self-pay

## 2021-07-01 DIAGNOSIS — Z111 Encounter for screening for respiratory tuberculosis: Secondary | ICD-10-CM

## 2021-07-01 MED ORDER — VYVANSE 40 MG PO CHEW
1.0000 | CHEWABLE_TABLET | Freq: Every day | ORAL | 0 refills | Status: DC
Start: 2021-07-01 — End: 2021-07-29
  Filled 2021-07-01: qty 30, 30d supply, fill #0

## 2021-07-04 LAB — QUANTIFERON-TB GOLD PLUS
QuantiFERON Mitogen Value: >10 IU/mL
QuantiFERON Nil Value: 0 IU/mL
QuantiFERON TB1 Ag Value: 0 IU/mL
QuantiFERON TB2 Ag Value: 0 IU/mL
QuantiFERON-TB Gold Plus: NEGATIVE

## 2021-07-10 ENCOUNTER — Other Ambulatory Visit (HOSPITAL_COMMUNITY): Payer: Self-pay

## 2021-07-17 ENCOUNTER — Other Ambulatory Visit (HOSPITAL_COMMUNITY): Payer: Self-pay

## 2021-07-24 ENCOUNTER — Encounter: Payer: Self-pay | Admitting: Internal Medicine

## 2021-07-24 ENCOUNTER — Other Ambulatory Visit (HOSPITAL_COMMUNITY): Payer: Self-pay

## 2021-07-29 ENCOUNTER — Other Ambulatory Visit (HOSPITAL_COMMUNITY): Payer: Self-pay

## 2021-07-29 MED ORDER — VYVANSE 40 MG PO CHEW
1.0000 | CHEWABLE_TABLET | Freq: Every day | ORAL | 0 refills | Status: DC
Start: 2021-07-29 — End: 2021-09-04
  Filled 2021-07-29: qty 30, 30d supply, fill #0

## 2021-08-01 ENCOUNTER — Other Ambulatory Visit (HOSPITAL_COMMUNITY): Payer: Self-pay

## 2021-08-11 ENCOUNTER — Other Ambulatory Visit (HOSPITAL_BASED_OUTPATIENT_CLINIC_OR_DEPARTMENT_OTHER): Payer: Self-pay | Admitting: Obstetrics & Gynecology

## 2021-08-12 ENCOUNTER — Other Ambulatory Visit (HOSPITAL_COMMUNITY): Payer: Self-pay

## 2021-08-12 MED ORDER — TARINA 24 FE 1-20 MG-MCG(24) PO TABS
ORAL_TABLET | ORAL | 1 refills | Status: DC
  Filled 2021-08-12: qty 84, 84d supply, fill #0
  Filled 2021-11-03: qty 84, 84d supply, fill #1

## 2021-08-13 ENCOUNTER — Other Ambulatory Visit (HOSPITAL_COMMUNITY): Payer: Self-pay

## 2021-08-19 ENCOUNTER — Other Ambulatory Visit (HOSPITAL_COMMUNITY): Payer: Self-pay

## 2021-09-03 ENCOUNTER — Other Ambulatory Visit (HOSPITAL_COMMUNITY): Payer: Self-pay

## 2021-09-04 ENCOUNTER — Other Ambulatory Visit (HOSPITAL_COMMUNITY): Payer: Self-pay

## 2021-09-04 MED ORDER — VYVANSE 40 MG PO CHEW
1.0000 | CHEWABLE_TABLET | Freq: Every day | ORAL | 0 refills | Status: DC
Start: 2021-09-04 — End: 2021-11-04
  Filled 2021-09-04: qty 30, 30d supply, fill #0

## 2021-09-08 ENCOUNTER — Other Ambulatory Visit (HOSPITAL_COMMUNITY): Payer: Self-pay

## 2021-09-30 ENCOUNTER — Encounter: Payer: Self-pay | Admitting: Internal Medicine

## 2021-11-03 ENCOUNTER — Encounter: Payer: Self-pay | Admitting: Internal Medicine

## 2021-11-03 ENCOUNTER — Other Ambulatory Visit (HOSPITAL_COMMUNITY): Payer: Self-pay

## 2021-11-04 ENCOUNTER — Other Ambulatory Visit (HOSPITAL_COMMUNITY): Payer: Self-pay

## 2021-11-04 MED ORDER — LISDEXAMFETAMINE DIMESYLATE 40 MG PO CHEW
40.0000 mg | CHEWABLE_TABLET | Freq: Every day | ORAL | 0 refills | Status: AC
Start: 2021-11-04 — End: ?
  Filled 2021-11-04: qty 30, 30d supply, fill #0

## 2021-11-05 ENCOUNTER — Other Ambulatory Visit (HOSPITAL_COMMUNITY): Payer: Self-pay

## 2021-12-08 ENCOUNTER — Encounter: Payer: Self-pay | Admitting: Internal Medicine

## 2022-01-12 ENCOUNTER — Other Ambulatory Visit (HOSPITAL_BASED_OUTPATIENT_CLINIC_OR_DEPARTMENT_OTHER): Payer: Self-pay | Admitting: Obstetrics & Gynecology

## 2022-01-12 ENCOUNTER — Other Ambulatory Visit (HOSPITAL_COMMUNITY): Payer: Self-pay

## 2022-01-13 MED ORDER — TARINA 24 FE 1-20 MG-MCG(24) PO TABS
1.0000 | ORAL_TABLET | Freq: Every day | ORAL | 0 refills | Status: DC
  Filled 2022-01-13: qty 84, 84d supply, fill #0

## 2022-01-14 ENCOUNTER — Other Ambulatory Visit (HOSPITAL_COMMUNITY): Payer: Self-pay

## 2022-01-29 ENCOUNTER — Other Ambulatory Visit (HOSPITAL_COMMUNITY): Payer: Self-pay

## 2022-01-29 ENCOUNTER — Encounter (HOSPITAL_BASED_OUTPATIENT_CLINIC_OR_DEPARTMENT_OTHER): Payer: Self-pay | Admitting: Medical

## 2022-01-29 ENCOUNTER — Ambulatory Visit (HOSPITAL_BASED_OUTPATIENT_CLINIC_OR_DEPARTMENT_OTHER): Payer: 59 | Admitting: Medical

## 2022-01-29 VITALS — BP 138/85 | HR 91 | Ht 66.0 in | Wt 175.8 lb

## 2022-01-29 DIAGNOSIS — B372 Candidiasis of skin and nail: Secondary | ICD-10-CM

## 2022-01-29 DIAGNOSIS — Z3041 Encounter for surveillance of contraceptive pills: Secondary | ICD-10-CM | POA: Diagnosis not present

## 2022-01-29 DIAGNOSIS — Z01419 Encounter for gynecological examination (general) (routine) without abnormal findings: Secondary | ICD-10-CM | POA: Diagnosis not present

## 2022-01-29 MED ORDER — TARINA 24 FE 1-20 MG-MCG(24) PO TABS
1.0000 | ORAL_TABLET | Freq: Every day | ORAL | 3 refills | Status: DC
  Filled 2022-01-29 – 2022-03-18 (×2): qty 84, 84d supply, fill #0
  Filled 2022-07-16: qty 84, 84d supply, fill #1
  Filled 2022-09-04 – 2022-10-10 (×2): qty 84, 84d supply, fill #2
  Filled 2023-01-09: qty 84, 84d supply, fill #3

## 2022-01-29 MED ORDER — TRIAMCINOLONE ACETONIDE 0.5 % EX OINT
1.0000 | TOPICAL_OINTMENT | Freq: Two times a day (BID) | CUTANEOUS | 0 refills | Status: DC
  Filled 2022-01-29: qty 30, 15d supply, fill #0

## 2022-01-29 MED ORDER — NYSTATIN 100000 UNIT/GM EX CREA
TOPICAL_CREAM | CUTANEOUS | 0 refills | Status: DC
  Filled 2022-01-29: qty 15, 7d supply, fill #0

## 2022-01-29 NOTE — Progress Notes (Signed)
History:  Ms. Mary Steele is a 22 y.o. G0P0000 who presents to clinic today for annual exam. Last pap smear was 07/2020 and normal. Patient is on OCPs and denies any concerns and would like to continue. LMP 01/08/22. Periods are regular lasting 3-4 days per month. She denies bleeding, abnormal discharge, UTI symptoms, GI issues today. She is engaged, but not currently sexually active as her fiance is in the [employer] and in [place] currently.   The following portions of the patient's history were reviewed and updated as appropriate: allergies, current medications, family history, past medical history, social history, past surgical history and problem list.  Review of Systems:  Review of Systems  Constitutional:  Negative for fever and malaise/fatigue.  Gastrointestinal:  Negative for abdominal pain, constipation, diarrhea, nausea and vomiting.  Genitourinary:  Negative for dysuria, frequency and urgency.       Neg - vaginal bleeding, discharge, pelvic pain      Objective:  Physical Exam BP 138/85   Pulse 91   Ht 5\' 6"  (1.676 m)   Wt 175 lb 12.8 oz (79.7 kg)   LMP 01/08/2022 (Exact Date)   BMI 28.37 kg/m  Physical Exam Vitals and nursing note reviewed. Exam conducted with a chaperone present.  Constitutional:      General: She is not in acute distress.    Appearance: Normal appearance. She is well-developed and normal weight.  HENT:     Head: Normocephalic and atraumatic.  Neck:     Thyroid: No thyromegaly.  Cardiovascular:     Rate and Rhythm: Normal rate and regular rhythm.     Heart sounds: No murmur heard. Pulmonary:     Effort: Pulmonary effort is normal. No respiratory distress.     Breath sounds: Normal breath sounds. No wheezing.  Chest:  Breasts:    Right: Normal.     Left: Normal.  Abdominal:     General: Abdomen is flat. Bowel sounds are normal. There is no distension.     Palpations: Abdomen is soft. There is no mass.     Tenderness: There is no abdominal  tenderness. There is no guarding or rebound.  Genitourinary:    General: Normal vulva.     Pubic Area: Rash (mild diffuce erythema noted on the labia) present.     Vagina: Vaginal discharge (small, white) present. No bleeding.     Cervix: No cervical motion tenderness.     Uterus: Not enlarged and not tender.      Adnexa:        Right: No mass or tenderness.         Left: No mass or tenderness.    Musculoskeletal:     Cervical back: Neck supple.  Skin:    General: Skin is warm and dry.     Findings: No erythema.  Neurological:     Mental Status: She is alert and oriented to person, place, and time.  Psychiatric:        Mood and Affect: Mood normal.     Health Maintenance Due  Topic Date Due   COVID-19 Vaccine (4 - 2023-24 season) 09/25/2021    Labs, imaging and previous visits in Epic and Care Everywhere reviewed  Assessment & Plan:  1. Well woman exam with routine gynecological exam - Normal pap smear 07/2020  2. Oral contraceptive pill surveillance - Norethindrone Acetate-Ethinyl Estrad-FE (TARINA 24 FE) 1-20 MG-MCG(24) tablet; Take 1 tablet by mouth daily.  Dispense: 84 tablet; Refill: 3  3. Yeast  of the skin - Rx for Triamcinolone and Nystatin sent to patient's pharmacy  - Discussed hygiene products for avoiding irritation   Patient to return to [facility] in 1 year for annual exam or sooner PRN  Luvenia Redden, PA-C 01/29/2022 10:09 AM

## 2022-02-12 ENCOUNTER — Other Ambulatory Visit (HOSPITAL_COMMUNITY): Payer: Self-pay

## 2022-02-12 MED ORDER — LISDEXAMFETAMINE DIMESYLATE 40 MG PO CHEW
1.0000 | CHEWABLE_TABLET | Freq: Every day | ORAL | 0 refills | Status: DC
  Filled 2022-02-12: qty 30, 30d supply, fill #0

## 2022-02-15 ENCOUNTER — Other Ambulatory Visit (HOSPITAL_COMMUNITY): Payer: Self-pay

## 2022-02-23 ENCOUNTER — Other Ambulatory Visit (HOSPITAL_COMMUNITY): Payer: Self-pay

## 2022-03-16 ENCOUNTER — Other Ambulatory Visit (HOSPITAL_COMMUNITY): Payer: Self-pay

## 2022-03-18 ENCOUNTER — Other Ambulatory Visit (HOSPITAL_COMMUNITY): Payer: Self-pay

## 2022-03-18 ENCOUNTER — Other Ambulatory Visit: Payer: Self-pay

## 2022-03-19 ENCOUNTER — Other Ambulatory Visit (HOSPITAL_COMMUNITY): Payer: Self-pay

## 2022-03-19 MED ORDER — LISDEXAMFETAMINE DIMESYLATE 40 MG PO CHEW
40.0000 mg | CHEWABLE_TABLET | Freq: Every day | ORAL | 0 refills | Status: DC
  Filled 2022-03-28: qty 30, 30d supply, fill #0

## 2022-03-28 ENCOUNTER — Other Ambulatory Visit (HOSPITAL_COMMUNITY): Payer: Self-pay

## 2022-03-29 ENCOUNTER — Other Ambulatory Visit (HOSPITAL_COMMUNITY): Payer: Self-pay

## 2022-03-30 ENCOUNTER — Other Ambulatory Visit (HOSPITAL_COMMUNITY): Payer: Self-pay

## 2022-05-01 ENCOUNTER — Other Ambulatory Visit (HOSPITAL_COMMUNITY): Payer: Self-pay

## 2022-05-03 ENCOUNTER — Other Ambulatory Visit: Payer: Self-pay

## 2022-05-03 ENCOUNTER — Other Ambulatory Visit (HOSPITAL_COMMUNITY): Payer: Self-pay

## 2022-05-03 MED ORDER — LISDEXAMFETAMINE DIMESYLATE 40 MG PO CHEW
CHEWABLE_TABLET | ORAL | 0 refills | Status: DC
  Filled 2022-05-03: qty 30, 30d supply, fill #0

## 2022-05-22 ENCOUNTER — Telehealth: Payer: 59 | Admitting: Family Medicine

## 2022-05-22 DIAGNOSIS — N898 Other specified noninflammatory disorders of vagina: Secondary | ICD-10-CM

## 2022-05-22 NOTE — Progress Notes (Signed)
Because [name], I feel your condition warrants further evaluation and I recommend that you be seen in a face to face visit.  The color of the dicharge is concerning and not normal for yeast or BV.    NOTE: There will be NO CHARGE for this eVisit   If you are having a true medical emergency please call 911.      For an urgent face to face visit, [facility] has eight urgent care centers for your convenience:   NEW!! [facility] Health Urgent Care Center at [facility] Get Driving Directions [phone] [address], [address] [place], [address]    [facility] Health Urgent Care Center at [facility] Get Driving Directions [phone] [address] [place], [place] [address]   [facility] Health Urgent Care Center [facility]) Get Driving Directions [phone] [address] [place], [place] [address]  [facility] Health Urgent Care Center [facility]) Get Driving Directions [phone] [address] [place],  [place]  [address]  [facility] Health Urgent Care Center [facility] - at [employer]  [phone] [address] [place],  [place] [address]   [facility] Health Urgent Care at [facility] Get Driving Directions [phone] [address] [place], [place] [address]   [facility] Urgent Care at [facility] Get Driving Directions  [phone] [address] [place], [place] [address]   [facility] Health Urgent Care at [facility] Directions [phone] [address], [address] [place], [place] [address]  Your MyChart E-visit questionnaire answers were reviewed by a board certified advanced clinical practitioner to complete your personal care plan based on your specific symptoms.  Thank you for using e-Visits.      have provided 5 minutes of non face to face time during this encounter for chart review and documentation.

## 2022-05-26 ENCOUNTER — Ambulatory Visit (HOSPITAL_BASED_OUTPATIENT_CLINIC_OR_DEPARTMENT_OTHER): Payer: Commercial Managed Care - PPO

## 2022-05-26 ENCOUNTER — Other Ambulatory Visit (HOSPITAL_COMMUNITY)
Admission: RE | Admit: 2022-05-26 | Discharge: 2022-05-26 | Disposition: A | Discharge status: Home / Self Care | Payer: Commercial Managed Care - PPO | Source: Ambulatory Visit | Attending: Obstetrics & Gynecology | Admitting: Obstetrics & Gynecology

## 2022-05-26 DIAGNOSIS — N898 Other specified noninflammatory disorders of vagina: Secondary | ICD-10-CM | POA: Insufficient documentation

## 2022-05-26 NOTE — Progress Notes (Signed)
Patient came in today to give a self swab aptima. Patient is complaining of vaginal itching and some vaginal discharge. tbw

## 2022-05-27 ENCOUNTER — Other Ambulatory Visit (HOSPITAL_COMMUNITY): Payer: Self-pay

## 2022-05-27 LAB — CERVICOVAGINAL ANCILLARY ONLY
Bacterial Vaginitis (gardnerella): NEGATIVE
Candida Glabrata: NEGATIVE
Candida Vaginitis: POSITIVE — AB
Comment: NEGATIVE
Comment: NEGATIVE
Comment: NEGATIVE

## 2022-05-28 ENCOUNTER — Other Ambulatory Visit (HOSPITAL_COMMUNITY): Payer: Self-pay

## 2022-05-28 ENCOUNTER — Other Ambulatory Visit (HOSPITAL_BASED_OUTPATIENT_CLINIC_OR_DEPARTMENT_OTHER): Payer: Self-pay | Admitting: *Deleted

## 2022-05-28 MED ORDER — FLUCONAZOLE 150 MG PO TABS
150.0000 mg | ORAL_TABLET | Freq: Once | ORAL | 0 refills | Status: AC
  Filled 2022-05-28: qty 1, 1d supply, fill #0

## 2022-05-28 NOTE — Progress Notes (Signed)
Rx sent to pharmacy for treatment of yeast  

## 2022-07-15 ENCOUNTER — Other Ambulatory Visit (HOSPITAL_COMMUNITY): Payer: Self-pay

## 2022-07-15 DIAGNOSIS — F902 Attention-deficit hyperactivity disorder, combined type: Secondary | ICD-10-CM | POA: Diagnosis not present

## 2022-07-15 DIAGNOSIS — Z79899 Other long term (current) drug therapy: Secondary | ICD-10-CM | POA: Diagnosis not present

## 2022-07-15 MED ORDER — LISDEXAMFETAMINE DIMESYLATE 40 MG PO CHEW
1.0000 | CHEWABLE_TABLET | Freq: Every day | ORAL | 0 refills | Status: DC
  Filled 2022-07-15: qty 30, 30d supply, fill #0

## 2022-09-04 ENCOUNTER — Other Ambulatory Visit (HOSPITAL_COMMUNITY): Payer: Self-pay

## 2022-09-06 ENCOUNTER — Other Ambulatory Visit: Payer: Self-pay

## 2022-09-06 ENCOUNTER — Other Ambulatory Visit (HOSPITAL_COMMUNITY): Payer: Self-pay

## 2022-09-06 MED ORDER — LISDEXAMFETAMINE DIMESYLATE 40 MG PO CHEW
40.0000 mg | CHEWABLE_TABLET | Freq: Every day | ORAL | 0 refills | Status: DC
  Filled 2022-09-06: qty 30, 30d supply, fill #0

## 2022-09-13 ENCOUNTER — Encounter (HOSPITAL_BASED_OUTPATIENT_CLINIC_OR_DEPARTMENT_OTHER): Payer: Self-pay

## 2022-09-13 ENCOUNTER — Ambulatory Visit (HOSPITAL_BASED_OUTPATIENT_CLINIC_OR_DEPARTMENT_OTHER): Payer: Commercial Managed Care - PPO | Admitting: Family Medicine

## 2022-09-16 ENCOUNTER — Encounter (HOSPITAL_BASED_OUTPATIENT_CLINIC_OR_DEPARTMENT_OTHER): Payer: Self-pay | Admitting: Family Medicine

## 2022-09-16 ENCOUNTER — Ambulatory Visit (INDEPENDENT_AMBULATORY_CARE_PROVIDER_SITE_OTHER): Payer: 59 | Admitting: Family Medicine

## 2022-09-16 VITALS — BP 120/76 | HR 66 | Ht 66.0 in | Wt 167.0 lb

## 2022-09-16 DIAGNOSIS — Z0000 Encounter for general adult medical examination without abnormal findings: Secondary | ICD-10-CM | POA: Diagnosis not present

## 2022-09-16 DIAGNOSIS — F900 Attention-deficit hyperactivity disorder, predominantly inattentive type: Secondary | ICD-10-CM

## 2022-09-16 NOTE — Progress Notes (Signed)
Complete physical exam  Patient: Mary Steele   DOB: [birth year]   22 y.o. Female  MRN: [id number]  Subjective:    Chief Complaint  Patient presents with   New Patient (Initial Visit)    Pt is here to get established with the practice. Is needing to have a physical performed.    Mary Steele is a 22 y.o. female who presents today for a complete physical exam. She reports consuming a general diet. Gym/ health club routine includes cardio, high impact aerobics , and mod to heavy weightlifting.She exercises 3-4 times per week. She generally feels well. She reports sleeping well. She does not have additional problems to discuss today.   She is a Engineer, civil (consulting) at [employer].   History of ADHD and is currently seeing [facility].  Vyvanse she takes 20mg  daily but is prescribed 40mg  daily for insurance purposes- has been on for years. No issues with current dose. She would like to switch over to management with one office.      Depression screenings:    09/16/2022    8:24 AM 01/29/2022    9:45 AM 07/25/2020    8:21 AM  Depression screen PHQ 2/9  Decreased Interest 0 0 0  Down, Depressed, Hopeless 0 0 0  PHQ - 2 Score 0 0 0  Altered sleeping 0    Tired, decreased energy 0    Change in appetite 0    Feeling bad or failure about yourself  0    Trouble concentrating 0    Moving slowly or fidgety/restless 0    Suicidal thoughts 0    PHQ-9 Score 0    Difficult doing work/chores Not difficult at all      Anxiety screenings:    09/16/2022    8:25 AM 09/03/2019   11:58 AM  GAD 7 : Generalized Anxiety Score  Nervous, Anxious, on Edge 0 1  Control/stop worrying 0 0  Worry too much - different things 0 3  Trouble relaxing 0 3  Restless 0 3  Easily annoyed or irritable 0 0  Afraid - awful might happen 0 0  Total GAD 7 Score 0 10  Anxiety Difficulty Not difficult at all    Vision:Not within last year  and Dental: No current dental problems and Receives regular  dental care No changes to vision   Patient Care Team: Lexine Baton (Inactive) as PCP - General (Physician Assistant)   Outpatient Medications Prior to Visit  Medication Sig   cetirizine (ZYRTEC) 10 MG tablet Take 10 mg by mouth daily.   Lisdexamfetamine Dimesylate (VYVANSE) 40 MG CHEW Chew 40 mg by mouth daily.   Norethindrone Acetate-Ethinyl Estrad-FE (TARINA 24 FE) 1-20 MG-MCG(24) tablet Take 1 tablet by mouth daily.   [DISCONTINUED] Lisdexamfetamine Dimesylate 40 MG CHEW Chew & swallow 1 tablet (40 mg total) by mouth daily.   [DISCONTINUED] nystatin cream (MYCOSTATIN) Apply to affected area 2 times daily   [DISCONTINUED] triamcinolone ointment (KENALOG) 0.5 % Apply 1 Application topically 2 (two) times daily.   No facility-administered medications prior to visit.    Review of Systems  Constitutional:  Negative for chills, fever, malaise/fatigue and weight loss.  HENT:  Negative for congestion and hearing loss.   Eyes:  Negative for blurred vision and double vision.  Respiratory:  Negative for cough and shortness of breath.   Cardiovascular:  Negative for chest pain, palpitations and leg swelling.  Gastrointestinal:  Negative for abdominal pain, nausea  and vomiting.  Genitourinary:  Negative for frequency, hematuria and urgency.  Musculoskeletal:  Negative for myalgias.  Skin:  Negative for rash.  Neurological:  Negative for dizziness, weakness and headaches.  Psychiatric/Behavioral:  Negative for depression and suicidal ideas. The patient is not nervous/anxious.        Objective:     BP 120/76 (BP Location: Left Arm, Patient Position: Sitting, Cuff Size: Normal)   Pulse 66   Ht 5\' 6"  (1.676 m)   Wt 167 lb (75.8 kg)   LMP 09/15/2022   SpO2 100%   BMI 26.95 kg/m  BP Readings from Last 3 Encounters:  09/16/22 120/76  01/29/22 138/85  06/24/21 122/88     Physical Exam Vitals reviewed.  Constitutional:      Appearance: Normal appearance.  HENT:      Right Ear: Tympanic membrane, ear canal and external ear normal.     Left Ear: Tympanic membrane, ear canal and external ear normal.     Nose: Nose normal.     Mouth/Throat:     Mouth: Mucous membranes are moist.     Pharynx: Oropharynx is clear.  Eyes:     Extraocular Movements: Extraocular movements intact.     Conjunctiva/sclera: Conjunctivae normal.     Pupils: Pupils are equal, round, and reactive to light.  Cardiovascular:     Rate and Rhythm: Normal rate and regular rhythm.     Pulses: Normal pulses.     Heart sounds: Normal heart sounds.  Pulmonary:     Effort: Pulmonary effort is normal.     Breath sounds: Normal breath sounds.  Abdominal:     General: Abdomen is flat. Bowel sounds are normal.     Palpations: Abdomen is soft.  Musculoskeletal:        General: Normal range of motion.     Cervical back: Normal range of motion and neck supple.  Skin:    General: Skin is warm and dry.  Neurological:     General: No focal deficit present.     Mental Status: She is alert and oriented to person, place, and time.  Psychiatric:        Mood and Affect: Mood normal.        Behavior: Behavior normal.        Thought Content: Thought content normal.        Judgment: Judgment normal.         Assessment & Plan:    Routine Health Maintenance and Physical Exam  Health Maintenance  Topic Date Due   COVID-19 Vaccine (4 - 2023-24 season) 09/25/2021   Flu Shot  08/26/2022   Pap Smear  07/26/2023   Pap Smear  07/26/2023   DTaP/Tdap/Td vaccine (8 - Td or Tdap) 07/25/2030   HPV Vaccine  Completed   Hepatitis C Screening  Completed   HIV Screening  Completed   1. Wellness examination Routine HCM fasting labs ordered. Will obtain labs today and update patient with results.  Review of PMH, FH, SH, medications and HM performed. Preventative care hand-out provided.  Recommend healthy diet.  Recommend approximately 150 minutes/week of moderate intensity exercise. Recommend regular  dental and vision exams. Always use seatbelt/lap and shoulder restraints. Recommend using smoke alarms and checking batteries at least twice a year. Recommend using sunscreen when outside. Discussed immunization recommendations. Plans to get influenza vaccine at work. Last pap smear: 07/25/2020, NILM - CBC with Differential/Platelet - Comprehensive metabolic panel - Hemoglobin A1c - Lipid panel - TSH Rfx  on Abnormal to Free T4  2. Attention deficit hyperactivity disorder (ADHD), predominantly inattentive type Patient has a history of ADHD and is currently taking Vyvanse 40mg  daily (she usually splits pill in half and takes 20mg  daily). She has been on this medication for "many years" and would like to switch over her care to this office. She has been seeing [facility] for management. She would like to switch over management to this office for convenience. Advised patient I am comfortable with this with regular 3 month follow-ups. Will obtain records from [facility] and review.    Return in about 3 months (around 12/17/2022) for ADHD f/u (can be virtual).     [name], FNP

## 2022-09-17 LAB — COMPREHENSIVE METABOLIC PANEL
ALT: 22 IU/L (ref 0–32)
AST: 21 IU/L (ref 0–40)
Albumin: 4.3 g/dL (ref 4.0–5.0)
Alkaline Phosphatase: 63 IU/L (ref 44–121)
BUN/Creatinine Ratio: 14 (ref 9–23)
BUN: 13 mg/dL (ref 6–20)
Bilirubin Total: 0.4 mg/dL (ref 0.0–1.2)
CO2: 23 mmol/L (ref 20–29)
Calcium: 9.3 mg/dL (ref 8.7–10.2)
Chloride: 104 mmol/L (ref 96–106)
Creatinine, Ser: 0.92 mg/dL (ref 0.57–1.00)
Globulin, Total: 2.8 g/dL (ref 1.5–4.5)
Glucose: 85 mg/dL (ref 70–99)
Potassium: 4.2 mmol/L (ref 3.5–5.2)
Sodium: 139 mmol/L (ref 134–144)
Total Protein: 7.1 g/dL (ref 6.0–8.5)
eGFR: 90 mL/min/{1.73_m2} (ref >59)

## 2022-09-17 LAB — LIPID PANEL
Chol/HDL Ratio: 3 ratio (ref 0.0–4.4)
Cholesterol, Total: 200 mg/dL — ABNORMAL HIGH (ref 100–199)
HDL: 66 mg/dL (ref >39)
LDL Chol Calc (NIH): 122 mg/dL — ABNORMAL HIGH (ref 0–99)
Triglycerides: 68 mg/dL (ref 0–149)
VLDL Cholesterol Cal: 12 mg/dL (ref 5–40)

## 2022-09-17 LAB — CBC WITH DIFFERENTIAL/PLATELET
Basophils Absolute: 0.1 10*3/uL (ref 0.0–0.2)
Basos: 1 %
EOS (ABSOLUTE): 0.2 10*3/uL (ref 0.0–0.4)
Eos: 3 %
Hematocrit: 44.3 % (ref 34.0–46.6)
Hemoglobin: 14.9 g/dL (ref 11.1–15.9)
Immature Grans (Abs): 0 10*3/uL (ref 0.0–0.1)
Immature Granulocytes: 0 %
Lymphocytes Absolute: 3.3 10*3/uL — ABNORMAL HIGH (ref 0.7–3.1)
Lymphs: 46 %
MCH: 31.1 pg (ref 26.6–33.0)
MCHC: 33.6 g/dL (ref 31.5–35.7)
MCV: 93 fL (ref 79–97)
Monocytes Absolute: 0.5 10*3/uL (ref 0.1–0.9)
Monocytes: 6 %
Neutrophils Absolute: 3.1 10*3/uL (ref 1.4–7.0)
Neutrophils: 44 %
Platelets: 281 10*3/uL (ref 150–450)
RBC: 4.79 x10E6/uL (ref 3.77–5.28)
RDW: 12.2 % (ref 11.7–15.4)
WBC: 7.2 10*3/uL (ref 3.4–10.8)

## 2022-09-17 LAB — HEMOGLOBIN A1C
Est. average glucose Bld gHb Est-mCnc: 94 mg/dL
Hgb A1c MFr Bld: 4.9 % (ref 4.8–5.6)

## 2022-09-17 LAB — TSH RFX ON ABNORMAL TO FREE T4: TSH: 2.85 u[IU]/mL (ref 0.450–4.500)

## 2022-10-22 ENCOUNTER — Other Ambulatory Visit (HOSPITAL_COMMUNITY): Payer: Self-pay

## 2022-10-25 ENCOUNTER — Other Ambulatory Visit (HOSPITAL_COMMUNITY): Payer: Self-pay

## 2022-10-25 ENCOUNTER — Other Ambulatory Visit (HOSPITAL_BASED_OUTPATIENT_CLINIC_OR_DEPARTMENT_OTHER): Payer: Self-pay | Admitting: Family Medicine

## 2022-10-25 ENCOUNTER — Telehealth (HOSPITAL_BASED_OUTPATIENT_CLINIC_OR_DEPARTMENT_OTHER): Payer: Self-pay | Admitting: Family Medicine

## 2022-10-25 DIAGNOSIS — F900 Attention-deficit hyperactivity disorder, predominantly inattentive type: Secondary | ICD-10-CM

## 2022-10-25 MED ORDER — LISDEXAMFETAMINE DIMESYLATE 40 MG PO CHEW
40.0000 mg | CHEWABLE_TABLET | Freq: Every day | ORAL | 0 refills | Status: DC
Start: 2022-10-25 — End: 2022-12-17
  Filled 2022-10-25: qty 30, 30d supply, fill #0

## 2022-10-25 NOTE — Progress Notes (Signed)
PDMP reviewed, no red flags present. Refill sent to pharmacy on file.

## 2022-10-25 NOTE — Telephone Encounter (Signed)
Prescription Request  10/25/2022  LOV: 09/16/2022  What is the name of the medication or equipment? Vyvannse  Have you contacted your pharmacy to request a refill? Yes   Which pharmacy would you like this sent to?  [place] [facility] [address] [place] [address] Phone: [phone] Fax: [phone]   - [facility] [address] [place] [address] Phone: [phone] Fax: [phone]  [facility] [phone] [facility] [place], [place] - [address] [place] [address] Phone: [phone] Fax: [phone]    Patient notified that their request is being sent to the clinical staff for review and that they should receive a response within 2 business days.   Please advise at [facility] [phone]

## 2022-10-26 ENCOUNTER — Other Ambulatory Visit (HOSPITAL_COMMUNITY): Payer: Self-pay

## 2022-10-27 ENCOUNTER — Other Ambulatory Visit (HOSPITAL_COMMUNITY): Payer: Self-pay

## 2022-10-27 MED ORDER — LISDEXAMFETAMINE DIMESYLATE 40 MG PO CHEW
CHEWABLE_TABLET | ORAL | 0 refills | Status: DC
  Filled 2022-10-27 – 2022-11-30 (×2): qty 30, 30d supply, fill #0

## 2022-10-29 ENCOUNTER — Other Ambulatory Visit (HOSPITAL_COMMUNITY): Payer: Self-pay

## 2022-11-30 ENCOUNTER — Other Ambulatory Visit (HOSPITAL_COMMUNITY): Payer: Self-pay

## 2022-12-16 ENCOUNTER — Encounter (HOSPITAL_BASED_OUTPATIENT_CLINIC_OR_DEPARTMENT_OTHER): Payer: Self-pay | Admitting: Family Medicine

## 2022-12-16 ENCOUNTER — Encounter (HOSPITAL_BASED_OUTPATIENT_CLINIC_OR_DEPARTMENT_OTHER): Payer: Self-pay

## 2022-12-17 ENCOUNTER — Other Ambulatory Visit (HOSPITAL_COMMUNITY): Payer: Self-pay

## 2022-12-17 ENCOUNTER — Telehealth (HOSPITAL_BASED_OUTPATIENT_CLINIC_OR_DEPARTMENT_OTHER): Payer: 59 | Admitting: Family Medicine

## 2022-12-17 ENCOUNTER — Encounter (HOSPITAL_BASED_OUTPATIENT_CLINIC_OR_DEPARTMENT_OTHER): Payer: Self-pay | Admitting: Family Medicine

## 2022-12-17 DIAGNOSIS — F900 Attention-deficit hyperactivity disorder, predominantly inattentive type: Secondary | ICD-10-CM

## 2022-12-17 MED ORDER — LISDEXAMFETAMINE DIMESYLATE 40 MG PO CHEW
40.0000 mg | CHEWABLE_TABLET | Freq: Every day | ORAL | 0 refills | Status: DC
  Filled 2023-01-09: qty 30, 30d supply, fill #0

## 2022-12-17 NOTE — Progress Notes (Unsigned)
   Virtual Visit via Video Note  I connected with Mary Steele on 12/17/22 at 8:10AM by a video enabled telemedicine application and verified that I am speaking with the correct person using two identifiers.  Patient Location: Home Provider Location: Office/Clinic  I discussed the limitations, risks, security, and privacy concerns of performing an evaluation and management service by video and the availability of in person appointments. I also discussed with the patient that there may be a patient responsible charge related to this service. The patient expressed understanding and agreed to proceed.  Subjective: PCP: [name], FNP  Mary Steele is a pleasant 22 year old who presents for a virtual visit today for ADHD follow-up. She was previously seeing [facility] and has switched to [facility] for continuity of care and care in one location. She has been doing well on Vyvanse and usually takes half a pill (20mg ). On occasions, she will take the other half on night shift for a total of 40mg . Denies issues with sleeping. Reports her attention/concentration is well controlled.   ROS: Per HPI  Current Outpatient Medications:    cetirizine (ZYRTEC) 10 MG tablet, Take 10 mg by mouth daily., Disp: , Rfl:    Norethindrone Acetate-Ethinyl Estrad-FE (TARINA 24 FE) 1-20 MG-MCG(24) tablet, Take 1 tablet by mouth daily., Disp: 84 tablet, Rfl: 3   [START ON 12/30/2022] Lisdexamfetamine Dimesylate (VYVANSE) 40 MG CHEW, Chew 1 tablet (40 mg total) by mouth daily., Disp: 30 tablet, Rfl: 0  Observations/Objective: Today's Vitals   12/17/22 0809  Weight: 153 lb (69.4 kg)  Height: 5\' 6"  (1.676 m)    General: Alert and oriented x 4. Speaking in clear and full sentences, no audible heavy breathing, no acute distress.  Sounds alert and appropriately interactive.  Appears well.  Face symmetric.  Extraocular movements intact.  Pupils equal and round.  No nasal flaring or  accessory muscle use visualized.  Assessment and Plan: 1. Attention deficit hyperactivity disorder (ADHD), predominantly inattentive type Patient presents today for ADHD follow-up. She reports she has been doing well on this medication and would like to continue with this regimen. Denies negative impact on her sleep, heart palpitations, and increase in anxiety. She would like to continue with this current medication regimen. PDMP reviewed, no red flags. Refill provided to start next month.  - Lisdexamfetamine Dimesylate (VYVANSE) 40 MG CHEW; Chew 1 tablet (40 mg total) by mouth daily.  Dispense: 30 tablet; Refill: 0   Follow Up Instructions: Return in about 3 months (around 03/19/2023) for ADHD f/u .   I discussed the assessment and treatment plan with the patient. The patient was provided an opportunity to ask questions, and all were answered. The patient agreed with the plan and demonstrated an understanding of the instructions.   The patient was advised to call back or seek an in-person evaluation if the symptoms worsen or if the condition fails to improve as anticipated.  The above assessment and management plan was discussed with the patient. The patient verbalized understanding of and has agreed to the management plan.   [name], FNP

## 2022-12-20 ENCOUNTER — Encounter (HOSPITAL_BASED_OUTPATIENT_CLINIC_OR_DEPARTMENT_OTHER): Payer: Self-pay | Admitting: Family Medicine

## 2023-01-05 DIAGNOSIS — Z0279 Encounter for issue of other medical certificate: Secondary | ICD-10-CM

## 2023-01-10 ENCOUNTER — Other Ambulatory Visit: Payer: Self-pay

## 2023-01-10 ENCOUNTER — Other Ambulatory Visit (HOSPITAL_COMMUNITY): Payer: Self-pay

## 2023-02-04 ENCOUNTER — Ambulatory Visit (HOSPITAL_BASED_OUTPATIENT_CLINIC_OR_DEPARTMENT_OTHER): Payer: Self-pay | Admitting: Obstetrics & Gynecology

## 2023-02-23 ENCOUNTER — Other Ambulatory Visit (HOSPITAL_BASED_OUTPATIENT_CLINIC_OR_DEPARTMENT_OTHER): Payer: Self-pay | Admitting: Family Medicine

## 2023-02-23 ENCOUNTER — Other Ambulatory Visit (HOSPITAL_BASED_OUTPATIENT_CLINIC_OR_DEPARTMENT_OTHER): Payer: Self-pay | Admitting: Medical

## 2023-02-23 DIAGNOSIS — Z3041 Encounter for surveillance of contraceptive pills: Secondary | ICD-10-CM

## 2023-02-23 DIAGNOSIS — F900 Attention-deficit hyperactivity disorder, predominantly inattentive type: Secondary | ICD-10-CM

## 2023-03-02 ENCOUNTER — Other Ambulatory Visit (HOSPITAL_BASED_OUTPATIENT_CLINIC_OR_DEPARTMENT_OTHER): Payer: Self-pay | Admitting: Family Medicine

## 2023-03-02 ENCOUNTER — Other Ambulatory Visit (HOSPITAL_COMMUNITY): Payer: Self-pay

## 2023-03-02 ENCOUNTER — Other Ambulatory Visit: Payer: Self-pay | Admitting: *Deleted

## 2023-03-02 DIAGNOSIS — F900 Attention-deficit hyperactivity disorder, predominantly inattentive type: Secondary | ICD-10-CM

## 2023-03-02 DIAGNOSIS — Z3041 Encounter for surveillance of contraceptive pills: Secondary | ICD-10-CM

## 2023-03-02 MED ORDER — TARINA 24 FE 1-20 MG-MCG(24) PO TABS
1.0000 | ORAL_TABLET | Freq: Every day | ORAL | 0 refills | Status: DC
  Filled 2023-03-02: qty 84, 84d supply, fill #0

## 2023-03-02 MED ORDER — LISDEXAMFETAMINE DIMESYLATE 40 MG PO CHEW
40.0000 mg | CHEWABLE_TABLET | Freq: Every day | ORAL | 0 refills | Status: DC
  Filled 2023-03-02: qty 30, 30d supply, fill #0

## 2023-03-02 NOTE — Progress Notes (Signed)
 Telephone request from [facility] pharmacy for birth control refill. Pt is due for Annual Exam. Rx e-prescribed for 3 months supply and message for pt to schedule appt.

## 2023-03-02 NOTE — Telephone Encounter (Signed)
 PDMP reviewed, no red flags. Refill sent to pharmacy on file.

## 2023-03-03 ENCOUNTER — Other Ambulatory Visit (HOSPITAL_COMMUNITY): Payer: Self-pay

## 2023-03-10 ENCOUNTER — Ambulatory Visit (HOSPITAL_BASED_OUTPATIENT_CLINIC_OR_DEPARTMENT_OTHER): Payer: 59 | Admitting: Obstetrics & Gynecology

## 2023-03-11 ENCOUNTER — Other Ambulatory Visit (HOSPITAL_COMMUNITY): Payer: Self-pay

## 2023-03-11 ENCOUNTER — Encounter (HOSPITAL_BASED_OUTPATIENT_CLINIC_OR_DEPARTMENT_OTHER): Payer: Self-pay | Admitting: Certified Nurse Midwife

## 2023-03-11 ENCOUNTER — Ambulatory Visit (HOSPITAL_BASED_OUTPATIENT_CLINIC_OR_DEPARTMENT_OTHER): Payer: 59 | Admitting: Certified Nurse Midwife

## 2023-03-11 ENCOUNTER — Other Ambulatory Visit (HOSPITAL_COMMUNITY)
Admission: RE | Admit: 2023-03-11 | Discharge: 2023-03-11 | Disposition: A | Discharge status: Home / Self Care | Payer: 59 | Source: Ambulatory Visit | Attending: Certified Nurse Midwife | Admitting: Certified Nurse Midwife

## 2023-03-11 VITALS — BP 111/62 | HR 67 | Ht 67.0 in | Wt 132.6 lb

## 2023-03-11 DIAGNOSIS — Z3041 Encounter for surveillance of contraceptive pills: Secondary | ICD-10-CM

## 2023-03-11 DIAGNOSIS — Z1151 Encounter for screening for human papillomavirus (HPV): Secondary | ICD-10-CM | POA: Diagnosis not present

## 2023-03-11 DIAGNOSIS — Z124 Encounter for screening for malignant neoplasm of cervix: Secondary | ICD-10-CM

## 2023-03-11 DIAGNOSIS — Z01419 Encounter for gynecological examination (general) (routine) without abnormal findings: Secondary | ICD-10-CM

## 2023-03-11 MED ORDER — TARINA 24 FE 1-20 MG-MCG(24) PO TABS
1.0000 | ORAL_TABLET | Freq: Every day | ORAL | 12 refills | Status: AC
  Filled 2023-03-11: qty 90, 90d supply, fill #0
  Filled 2023-03-28: qty 84, 84d supply, fill #0
  Filled 2023-06-13 – 2023-06-14 (×2): qty 84, 84d supply, fill #1

## 2023-03-11 NOTE — Progress Notes (Signed)
ANNUAL EXAM Patient name: Mary Steele MRN [id number]  Date of birth: 2000/05/07 Chief Complaint:   Annual Exam  History of Present Illness:   Mary Steele is a 23 y.o. G0P0000 Caucasian Married female being seen today for a routine annual exam.  Current complaints: None. Her Spouse in stationed in [place], [place]. Pt will be moving to [place] for approx 3 years.   No LMP recorded. Last pap 07/25/2020. Results were: NILM w/ HRHPV negative. H/O abnormal pap: no      09/16/2022    8:24 AM 01/29/2022    9:45 AM 07/25/2020    8:21 AM 07/24/2020   11:35 AM 06/29/2019    9:47 AM  Depression screen PHQ 2/9  Decreased Interest 0 0 0 0 0  Down, Depressed, Hopeless 0 0 0 0 0  PHQ - 2 Score 0 0 0 0 0  Altered sleeping 0      Tired, decreased energy 0      Change in appetite 0      Feeling bad or failure about yourself  0      Trouble concentrating 0      Moving slowly or fidgety/restless 0      Suicidal thoughts 0      PHQ-9 Score 0      Difficult doing work/chores Not difficult at all            09/16/2022    8:25 AM 09/03/2019   11:58 AM  GAD 7 : Generalized Anxiety Score  Nervous, Anxious, on Edge 0 1  Control/stop worrying 0 0  Worry too much - different things 0 3  Trouble relaxing 0 3  Restless 0 3  Easily annoyed or irritable 0 0  Afraid - awful might happen 0 0  Total GAD 7 Score 0 10  Anxiety Difficulty Not difficult at all      Review of Systems:   Pertinent items are noted in HPI Denies any headaches, blurred vision, fatigue, shortness of breath, chest pain, abdominal pain, abnormal vaginal discharge/itching/odor/irritation, problems with periods, bowel movements, urination, or intercourse unless otherwise stated above. Pertinent History Reviewed:  Reviewed past medical,surgical, social and family history.  Reviewed problem list, medications and allergies. Physical Assessment:  There were no vitals filed for this visit.There is no height or weight on file to  calculate BMI.        Physical Examination:   General appearance - well appearing, and in no distress  Mental status - alert, oriented to person, place, and time  Psych:  She has a normal mood and affect  Skin - warm and dry, normal color, no suspicious lesions noted  Chest - effort normal  Neck:  midline trachea, no thyromegaly or nodules  Breasts - breasts appear normal, no suspicious masses, no skin or nipple changes or  axillary nodes  Abdomen - soft, nontender, nondistended, no masses or organomegaly  Pelvic - VULVA: normal appearing vulva with no masses, tenderness or lesions  VAGINA: normal appearing vagina with normal color and discharge, no lesions  CERVIX: normal appearing cervix without discharge or lesions, no CMT  Thin prep pap is done with  HR HPV cotesting  UTERUS: uterus is felt to be normal size, shape, consistency and nontender   ADNEXA: No adnexal masses or tenderness noted.  Extremities:  No swelling or varicosities noted  Chaperone present for exam  No results found for this or any previous visit (from the past 24 hours).  Assessment &  Plan:  1) Well-Woman Exam - Self breast awareness encouraged - Physical exam neg/normal - Routine pap smear collected for cervical cancer screening  2) Surveillance of Combined Oral Contraceptives - COC refill sent  Pt will RTO for annual gyn exam once she returns from [place].  Sigmund Hazel, [facility] 03/11/2023 8:38 AM

## 2023-03-14 LAB — CYTOLOGY - PAP
Adequacy: ABSENT
Comment: NEGATIVE
Diagnosis: NEGATIVE
High risk HPV: NEGATIVE

## 2023-03-15 ENCOUNTER — Encounter (HOSPITAL_BASED_OUTPATIENT_CLINIC_OR_DEPARTMENT_OTHER): Payer: Self-pay | Admitting: Certified Nurse Midwife

## 2023-03-29 ENCOUNTER — Ambulatory Visit (HOSPITAL_BASED_OUTPATIENT_CLINIC_OR_DEPARTMENT_OTHER): Payer: 59 | Admitting: Obstetrics & Gynecology

## 2023-03-29 ENCOUNTER — Telehealth: Payer: Self-pay | Admitting: Family Medicine

## 2023-03-29 ENCOUNTER — Other Ambulatory Visit (HOSPITAL_COMMUNITY): Payer: Self-pay

## 2023-03-29 ENCOUNTER — Encounter: Payer: Self-pay | Admitting: Family Medicine

## 2023-03-29 DIAGNOSIS — F900 Attention-deficit hyperactivity disorder, predominantly inattentive type: Secondary | ICD-10-CM

## 2023-03-29 NOTE — Addendum Note (Signed)
 Addended by: [name] on: 03/29/2023 04:48 PM   Modules accepted: Orders

## 2023-03-29 NOTE — Telephone Encounter (Signed)
 Routing to provider for review/authorization. Pt requesting alternate med due to cost.  Copied from CRM [id number]. Topic: Clinical - Prescription Issue >> Mar 29, 2023 12:32 PM DeAngela L wrote: Reason for CRM: Patient call about the price of the Adhd medication Lisdexamfetamine Dimesylate (VYVANSE) 40 MG CHEW prescription changed from $5 to $300 and would like to have the form or kind of pill changed so the price can be cheaper, before she get a new refill next month

## 2023-04-05 ENCOUNTER — Other Ambulatory Visit (HOSPITAL_COMMUNITY): Payer: Self-pay

## 2023-04-13 ENCOUNTER — Other Ambulatory Visit (HOSPITAL_COMMUNITY): Payer: Self-pay

## 2023-04-13 MED ORDER — LISDEXAMFETAMINE DIMESYLATE 40 MG PO CHEW
40.0000 mg | CHEWABLE_TABLET | Freq: Every day | ORAL | 0 refills | Status: DC
  Filled 2023-04-13: qty 30, 30d supply, fill #0

## 2023-04-15 ENCOUNTER — Other Ambulatory Visit (HOSPITAL_COMMUNITY): Payer: Self-pay

## 2023-04-30 ENCOUNTER — Other Ambulatory Visit (HOSPITAL_COMMUNITY): Payer: Self-pay

## 2023-05-04 ENCOUNTER — Other Ambulatory Visit: Payer: Self-pay | Admitting: Family Medicine

## 2023-05-04 ENCOUNTER — Other Ambulatory Visit (HOSPITAL_COMMUNITY): Payer: Self-pay

## 2023-05-04 DIAGNOSIS — F900 Attention-deficit hyperactivity disorder, predominantly inattentive type: Secondary | ICD-10-CM

## 2023-05-04 MED ORDER — LISDEXAMFETAMINE DIMESYLATE 40 MG PO CHEW: 40.0000 mg | CHEWABLE_TABLET | Freq: Every day | ORAL | 0 refills | Status: DC

## 2023-05-04 MED ORDER — LISDEXAMFETAMINE DIMESYLATE 40 MG PO CHEW
40.0000 mg | CHEWABLE_TABLET | Freq: Every day | ORAL | 0 refills | Status: DC
  Filled 2023-05-04 – 2023-05-22 (×2): qty 30, 30d supply, fill #0

## 2023-05-04 NOTE — Progress Notes (Signed)
 PDMP reviewed, no red flags present. Medication sent for 66-month supply. Will determine plan of care due to patient moving internationally in June of this year.

## 2023-05-23 ENCOUNTER — Other Ambulatory Visit (HOSPITAL_COMMUNITY): Payer: Self-pay

## 2023-05-23 ENCOUNTER — Other Ambulatory Visit: Payer: Self-pay

## 2023-05-24 ENCOUNTER — Other Ambulatory Visit (HOSPITAL_COMMUNITY): Payer: Self-pay

## 2023-05-25 ENCOUNTER — Other Ambulatory Visit (HOSPITAL_COMMUNITY): Payer: Self-pay

## 2023-05-26 ENCOUNTER — Other Ambulatory Visit (HOSPITAL_COMMUNITY): Payer: Self-pay

## 2023-06-14 ENCOUNTER — Other Ambulatory Visit (HOSPITAL_COMMUNITY): Payer: Self-pay

## 2023-06-14 ENCOUNTER — Other Ambulatory Visit: Payer: Self-pay

## 2023-06-27 ENCOUNTER — Ambulatory Visit (INDEPENDENT_AMBULATORY_CARE_PROVIDER_SITE_OTHER): Payer: Self-pay | Admitting: Family Medicine

## 2023-06-27 ENCOUNTER — Encounter: Payer: Self-pay | Admitting: Family Medicine

## 2023-06-27 ENCOUNTER — Other Ambulatory Visit (HOSPITAL_COMMUNITY): Payer: Self-pay

## 2023-06-27 VITALS — BP 128/84 | HR 80 | Temp 97.9°F | Resp 20 | Ht 67.0 in | Wt 158.0 lb

## 2023-06-27 DIAGNOSIS — Z0000 Encounter for general adult medical examination without abnormal findings: Secondary | ICD-10-CM

## 2023-06-27 DIAGNOSIS — F900 Attention-deficit hyperactivity disorder, predominantly inattentive type: Secondary | ICD-10-CM

## 2023-06-27 MED ORDER — LISDEXAMFETAMINE DIMESYLATE 40 MG PO CHEW
40.0000 mg | CHEWABLE_TABLET | Freq: Every day | ORAL | 0 refills | Status: DC
  Filled 2023-06-27 – 2023-07-05 (×3): qty 90, 90d supply, fill #0

## 2023-06-27 NOTE — Patient Instructions (Signed)

## 2023-06-27 NOTE — Progress Notes (Addendum)
   Established Patient Office Visit  Subjective  Patient ID: Mary Steele, female    DOB: 22-Aug-2000  Age: 23 y.o. MRN: [id number]  Chief Complaint  Patient presents with   ADHD   Patient is a pleasant 23 year old female patient who presents today for ADHD follow-up.  She is moving to [place] because her husband is in the [employer] on 6/17 She has talked with the case manager and she will be able to establish care- it may take a little while before she can get medications.  She was previously working night shift and would take 1/2 tab (20 mg) at the beginning of her shift and the other 1/2 tablet in the middle of her shift (20mg ).  Currently, she has been only taking 1/2 tablet because she is not working at this time. Denies adverse side effects.    ROS: see HPI     Objective:      BP 128/84 (BP Location: Left Arm, Patient Position: Sitting, Cuff Size: Normal)   Pulse 80   Temp 97.9 F (36.6 C) (Oral)   Resp 20   Ht 5\' 7"  (1.702 m)   Wt 158 lb (71.7 kg)   LMP 06/21/2023 (Approximate)   SpO2 98%   BMI 24.75 kg/m  BP Readings from Last 3 Encounters:  06/27/23 128/84  03/11/23 111/62  09/16/22 120/76     Physical Exam Vitals reviewed.  Constitutional:      Appearance: Normal appearance.  Cardiovascular:     Rate and Rhythm: Normal rate and regular rhythm.     Pulses: Normal pulses.     Heart sounds: Normal heart sounds.  Pulmonary:     Effort: Pulmonary effort is normal.     Breath sounds: Normal breath sounds.  Neurological:     Mental Status: She is alert.  Psychiatric:        Mood and Affect: Mood normal.        Behavior: Behavior normal.       Assessment & Plan:   1. Attention deficit hyperactivity disorder (ADHD), predominantly inattentive type Patient is moving to [place] to be with her husband, who has been stationed there for the past three years. She is currently taking Vyvanse  and varies between taking 1/2 tablet to 1 tablet (20 mg to 40 mg),  depending on her work schedule. She is doing well on this current dose and medication. Denies any adverse side effects. PDMP reviewed, no red flags present. Rx sent to pharmacy on file. Will send in 40-month prescription for patient due to relocation. Advised patient to reach out if she needs anything in the meantime.  - Lisdexamfetamine Dimesylate  (VYVANSE ) 40 MG CHEW; Chew 1 tablet (40 mg total) by mouth daily.  Dispense: 90 tablet; Refill: 0  Return if symptoms worsen or fail to improve.    Mary Hanson, FNP

## 2023-06-27 NOTE — Addendum Note (Signed)
 Addended by: Evans Him on: 06/27/2023 09:58 AM   Modules accepted: Orders

## 2023-06-28 LAB — LIPID PANEL
Chol/HDL Ratio: 2.5 ratio (ref 0.0–4.4)
Cholesterol, Total: 200 mg/dL — ABNORMAL HIGH (ref 100–199)
HDL: 81 mg/dL (ref >39)
LDL Chol Calc (NIH): 98 mg/dL (ref 0–99)
Triglycerides: 120 mg/dL (ref 0–149)
VLDL Cholesterol Cal: 21 mg/dL (ref 5–40)

## 2023-06-28 LAB — COMPREHENSIVE METABOLIC PANEL WITH GFR
ALT: 17 IU/L (ref 0–32)
AST: 19 IU/L (ref 0–40)
Albumin: 4.1 g/dL (ref 4.0–5.0)
Alkaline Phosphatase: 58 IU/L (ref 44–121)
BUN/Creatinine Ratio: 9 (ref 9–23)
BUN: 7 mg/dL (ref 6–20)
Bilirubin Total: 0.4 mg/dL (ref 0.0–1.2)
CO2: 20 mmol/L (ref 20–29)
Calcium: 9.2 mg/dL (ref 8.7–10.2)
Chloride: 104 mmol/L (ref 96–106)
Creatinine, Ser: 0.8 mg/dL (ref 0.57–1.00)
Globulin, Total: 2.4 g/dL (ref 1.5–4.5)
Glucose: 77 mg/dL (ref 70–99)
Potassium: 4.1 mmol/L (ref 3.5–5.2)
Sodium: 139 mmol/L (ref 134–144)
Total Protein: 6.5 g/dL (ref 6.0–8.5)
eGFR: 105 mL/min/{1.73_m2} (ref >59)

## 2023-06-28 LAB — CBC WITH DIFFERENTIAL/PLATELET
Basophils Absolute: 0.1 10*3/uL (ref 0.0–0.2)
Basos: 1 %
EOS (ABSOLUTE): 0.1 10*3/uL (ref 0.0–0.4)
Eos: 1 %
Hematocrit: 43.6 % (ref 34.0–46.6)
Hemoglobin: 14.4 g/dL (ref 11.1–15.9)
Immature Grans (Abs): 0 10*3/uL (ref 0.0–0.1)
Immature Granulocytes: 0 %
Lymphocytes Absolute: 2.1 10*3/uL (ref 0.7–3.1)
Lymphs: 35 %
MCH: 31.6 pg (ref 26.6–33.0)
MCHC: 33 g/dL (ref 31.5–35.7)
MCV: 96 fL (ref 79–97)
Monocytes Absolute: 0.3 10*3/uL (ref 0.1–0.9)
Monocytes: 6 %
Neutrophils Absolute: 3.4 10*3/uL (ref 1.4–7.0)
Neutrophils: 57 %
Platelets: 266 10*3/uL (ref 150–450)
RBC: 4.55 x10E6/uL (ref 3.77–5.28)
RDW: 11.8 % (ref 11.7–15.4)
WBC: 6 10*3/uL (ref 3.4–10.8)

## 2023-06-28 LAB — HEMOGLOBIN A1C
Est. average glucose Bld gHb Est-mCnc: 85 mg/dL
Hgb A1c MFr Bld: 4.6 % — ABNORMAL LOW (ref 4.8–5.6)

## 2023-06-28 LAB — TSH: TSH: 1 u[IU]/mL (ref 0.450–4.500)

## 2023-06-29 ENCOUNTER — Ambulatory Visit: Payer: Self-pay | Admitting: Family Medicine

## 2023-06-30 ENCOUNTER — Other Ambulatory Visit (HOSPITAL_COMMUNITY): Payer: Self-pay

## 2023-07-01 ENCOUNTER — Telehealth: Payer: Self-pay

## 2023-07-01 ENCOUNTER — Other Ambulatory Visit (HOSPITAL_COMMUNITY): Payer: Self-pay

## 2023-07-01 NOTE — Telephone Encounter (Signed)
(  Key: [facility])  PA Case ID #: [id number]  Rx #: [id number]  Status Sent to Plan today  Drug  Lisdexamfetamine Dimesylate  40MG  chewable tablets   Form [employer] Electronic PA Form [phone] NCPDP) Original Claim Info

## 2023-07-05 ENCOUNTER — Other Ambulatory Visit (HOSPITAL_COMMUNITY): Payer: Self-pay

## 2023-07-06 ENCOUNTER — Other Ambulatory Visit: Payer: Self-pay | Admitting: Family Medicine

## 2023-07-06 ENCOUNTER — Telehealth: Payer: Self-pay | Admitting: Family Medicine

## 2023-07-06 ENCOUNTER — Other Ambulatory Visit (HOSPITAL_COMMUNITY): Payer: Self-pay

## 2023-07-06 DIAGNOSIS — F900 Attention-deficit hyperactivity disorder, predominantly inattentive type: Secondary | ICD-10-CM

## 2023-07-06 MED ORDER — LISDEXAMFETAMINE DIMESYLATE 40 MG PO CHEW
40.0000 mg | CHEWABLE_TABLET | Freq: Every day | ORAL | 0 refills | Status: DC
  Filled 2023-07-06: qty 30, 30d supply, fill #0

## 2023-07-06 NOTE — Telephone Encounter (Signed)
 Your doctor recently requested approval for coverage of Lisdexamfetamine Dimes Tab Chew. After reviewing, this request was denied. Coverage is provided in situations where the patient has tried and failed mixed amphetamine salts ER (Adderall XR, generics) or another long acting amphetamine or amphetamine derivative type drug. Coverage cannot be authorized at this time

## 2023-07-06 NOTE — Telephone Encounter (Signed)
 Copied from CRM [id number]. Topic: Clinical - Prescription Issue >> Jul 06, 2023  1:18 PM Chrystal Crape R wrote: Pt would like to know the status of her Lisdexamfetamine Dimesylate  (VYVANSE ) 40 MG CHEW Prior auth.

## 2023-07-07 ENCOUNTER — Other Ambulatory Visit: Payer: Self-pay | Admitting: Family Medicine

## 2023-07-07 ENCOUNTER — Other Ambulatory Visit (HOSPITAL_COMMUNITY): Payer: Self-pay

## 2023-07-07 DIAGNOSIS — F900 Attention-deficit hyperactivity disorder, predominantly inattentive type: Secondary | ICD-10-CM

## 2023-07-08 ENCOUNTER — Other Ambulatory Visit (HOSPITAL_COMMUNITY): Payer: Self-pay

## 2023-07-08 ENCOUNTER — Other Ambulatory Visit: Payer: Self-pay | Admitting: Family Medicine

## 2023-07-08 DIAGNOSIS — F900 Attention-deficit hyperactivity disorder, predominantly inattentive type: Secondary | ICD-10-CM

## 2023-07-08 MED ORDER — LISDEXAMFETAMINE DIMESYLATE 40 MG PO CHEW
40.0000 mg | CHEWABLE_TABLET | Freq: Every day | ORAL | 0 refills | Status: AC
  Filled 2023-07-08 – 2023-07-11 (×2): qty 30, 30d supply, fill #0

## 2023-07-09 ENCOUNTER — Other Ambulatory Visit (HOSPITAL_COMMUNITY): Payer: Self-pay

## 2023-07-11 ENCOUNTER — Other Ambulatory Visit (HOSPITAL_COMMUNITY): Payer: Self-pay

## 2023-07-11 NOTE — Telephone Encounter (Signed)
 Copied from CRM [id number]. Topic: Clinical - Prescription Issue >> Jul 06, 2023  1:18 PM Chrystal Crape R wrote: Pt would like to know the status of her Lisdexamfetamine Dimesylate  (VYVANSE ) 40 MG CHEW Prior auth. >> Jul 11, 2023  9:38 AM Emylou G wrote: Patient adv moving tomorrow to [place].[facility] is sending over vyvanse  approval she said?

## 2023-07-11 NOTE — Telephone Encounter (Signed)
 Patient has been contacted via Mychart regarding prior authorization denial. Denial letter also sent to patient via Mychart.

## 2023-07-11 NOTE — Telephone Encounter (Signed)
(  Key: [id number])  PA Case ID #: [id number]   Status Sent to Plan today  Drug Lisdexamfetamine Dimesylate  40MG  chewable tablets  Form [employer] Electronic PA Form [phone] NCPDP)

## 2023-07-13 ENCOUNTER — Other Ambulatory Visit (HOSPITAL_COMMUNITY): Payer: Self-pay

## 2023-07-14 ENCOUNTER — Other Ambulatory Visit (HOSPITAL_COMMUNITY): Payer: Self-pay

## 2023-07-18 ENCOUNTER — Other Ambulatory Visit (HOSPITAL_COMMUNITY): Payer: Self-pay

## 2023-07-26 ENCOUNTER — Other Ambulatory Visit (HOSPITAL_COMMUNITY): Payer: Self-pay
